# Patient Record
Sex: Male | Born: 1990 | Hispanic: Yes | Marital: Single | Smoking: Current some day smoker
Health system: Southern US, Community
[De-identification: ages and names within clinical notes are randomized; demographics above are authoritative.]

---

## 2012-09-12 ENCOUNTER — Emergency Department (HOSPITAL_COMMUNITY): Payer: Self-pay

## 2012-09-12 ENCOUNTER — Other Ambulatory Visit: Payer: Self-pay

## 2012-09-12 ENCOUNTER — Encounter (HOSPITAL_COMMUNITY): Payer: Self-pay | Admitting: Emergency Medicine

## 2012-09-12 ENCOUNTER — Emergency Department (HOSPITAL_COMMUNITY)
Admission: EM | Admit: 2012-09-12 | Discharge: 2012-09-12 | Disposition: A | Discharge status: Home / Self Care | Payer: Self-pay | Attending: Emergency Medicine | Admitting: Emergency Medicine

## 2012-09-12 DIAGNOSIS — R062 Wheezing: Secondary | ICD-10-CM | POA: Insufficient documentation

## 2012-09-12 DIAGNOSIS — F411 Generalized anxiety disorder: Secondary | ICD-10-CM | POA: Insufficient documentation

## 2012-09-12 DIAGNOSIS — R0602 Shortness of breath: Secondary | ICD-10-CM | POA: Insufficient documentation

## 2012-09-12 DIAGNOSIS — R109 Unspecified abdominal pain: Secondary | ICD-10-CM | POA: Insufficient documentation

## 2012-09-12 DIAGNOSIS — R110 Nausea: Secondary | ICD-10-CM | POA: Insufficient documentation

## 2012-09-12 DIAGNOSIS — F1010 Alcohol abuse, uncomplicated: Secondary | ICD-10-CM | POA: Insufficient documentation

## 2012-09-12 LAB — COMPREHENSIVE METABOLIC PANEL
ALT: 21 U/L (ref 0–53)
Alkaline Phosphatase: 64 U/L (ref 39–117)
BUN: 11 mg/dL (ref 6–23)
Chloride: 98 mEq/L (ref 96–112)
GFR calc Af Amer: >90 mL/min (ref >90)
Glucose, Bld: 107 mg/dL — ABNORMAL HIGH (ref 70–99)
Potassium: 4.1 mEq/L (ref 3.5–5.1)
Sodium: 136 mEq/L (ref 135–145)
Total Bilirubin: 0.5 mg/dL (ref 0.3–1.2)
Total Protein: 7.3 g/dL (ref 6.0–8.3)

## 2012-09-12 LAB — CBC WITH DIFFERENTIAL/PLATELET
Eosinophils Absolute: 1.3 10*3/uL — ABNORMAL HIGH (ref 0.0–0.7)
Hemoglobin: 15.8 g/dL (ref 13.0–17.0)
Lymphocytes Relative: 18 % (ref 12–46)
Lymphs Abs: 2.5 10*3/uL (ref 0.7–4.0)
MCH: 32.8 pg (ref 26.0–34.0)
Monocytes Relative: 9 % (ref 3–12)
Neutro Abs: 9 10*3/uL — ABNORMAL HIGH (ref 1.7–7.7)
Neutrophils Relative %: 64 % (ref 43–77)
Platelets: 232 10*3/uL (ref 150–400)
RBC: 4.81 MIL/uL (ref 4.22–5.81)
WBC: 14.2 10*3/uL — ABNORMAL HIGH (ref 4.0–10.5)

## 2012-09-12 MED ORDER — OMEPRAZOLE 20 MG PO CPDR: DELAYED_RELEASE_CAPSULE | ORAL | Status: DC

## 2012-09-12 MED ORDER — ALBUTEROL SULFATE HFA 108 (90 BASE) MCG/ACT IN AERS
2.0000 | INHALATION_SPRAY | RESPIRATORY_TRACT | Status: DC | PRN
  Administered 2012-09-12: 2 via RESPIRATORY_TRACT
  Filled 2012-09-12: qty 6.7

## 2012-09-12 MED ORDER — LORAZEPAM 1 MG PO TABS
0.5000 mg | ORAL_TABLET | Freq: Once | ORAL | Status: AC
Start: 2012-09-12 — End: 2012-09-12
  Administered 2012-09-12: 0.5 mg via ORAL
  Filled 2012-09-12: qty 1

## 2012-09-12 NOTE — ED Provider Notes (Signed)
CSN: [id number]     Arrival date & time 09/12/12  1556 History     First MD Initiated Contact with Patient 09/12/12 1717     Chief Complaint  Patient presents with  . Shortness of Breath   (Consider location/radiation/quality/duration/timing/severity/associated sxs/prior Treatment) HPI Comments: Patient with history of alcohol abuse, smoking, born [place] -- presents with complaint of shortness of breath for the past 4 months. Shortness of breath has been intermittent but daily. He gets a panic attack when he feels short of breath. It felt worse today so the patient came to the emergency department for evaluation. Shortness of breath is worse at night and with activity. He has some occasional wheezing but no previous diagnosis of asthma. He denies history of tuberculosis or other lung disease. He denies risk factors for pulmonary embolism including recent travel or immobilization, recent surgery, history of blood clot, swelling in his legs. No treatments prior to arrival. Patient denies chest pain. He has vague generalized upper abdominal pain. Also bilateral lower abd pain, worse on left, that has been going on for weeks that is intermittent, not everyday. He admits to drinking heavily yesterday. No nausea, vomiting, or diarrhea. Onset of symptoms gradual. Nothing makes symptoms better.  Patient is a 22 y.o. male presenting with shortness of breath. The history is provided by the patient.  Shortness of Breath Associated symptoms: abdominal pain and wheezing   Associated symptoms: no chest pain, no cough, no fever, no headaches, no rash, no sore throat and no vomiting     History reviewed. No pertinent past medical history. History reviewed. No pertinent past surgical history. No family history on file. History  Substance Use Topics  . Smoking status: Not on file  . Smokeless tobacco: Not on file  . Alcohol Use: Not on file    Review of Systems  Constitutional: Negative for  fever.  HENT: Negative for sore throat and rhinorrhea.   Eyes: Negative for redness.  Respiratory: Positive for shortness of breath and wheezing. Negative for cough.   Cardiovascular: Negative for chest pain.  Gastrointestinal: Positive for nausea and abdominal pain. Negative for vomiting and diarrhea.  Genitourinary: Negative for dysuria.  Musculoskeletal: Negative for myalgias.  Skin: Negative for rash.  Neurological: Negative for headaches.    Allergies  Review of patient's allergies indicates not on file.  Home Medications  No current outpatient prescriptions on file. BP 120/66  Pulse 79  Temp(Src) 98.7 F (37.1 C) (Oral)  Resp 25  Ht 5\' 3"  (1.6 m)  Wt 160 lb (72.576 kg)  BMI 28.35 kg/m2  SpO2 100% Physical Exam  Nursing note and vitals reviewed. Constitutional: He appears well-developed and well-nourished.  HENT:  Head: Normocephalic and atraumatic.  Eyes: Conjunctivae are normal. Right eye exhibits no discharge. Left eye exhibits no discharge.  Neck: Normal range of motion. Neck supple.  Cardiovascular: Normal rate, regular rhythm and normal heart sounds.   No murmur heard. Pulmonary/Chest: Effort normal and breath sounds normal. No respiratory distress. He has no wheezes. He has no rales.  Abdominal: Soft. There is tenderness (mild) in the right upper quadrant, right lower quadrant, epigastric area, left upper quadrant and left lower quadrant. There is no rigidity, no rebound, no guarding, no CVA tenderness, no tenderness at McBurney's point and negative Murphy's sign.  Neurological: He is alert.  Skin: Skin is warm and dry.  Psychiatric: His mood appears anxious.    ED Course   Procedures (including critical care time)  Labs  Reviewed  CBC WITH DIFFERENTIAL - Abnormal; Notable for the following:    WBC 14.2 (*)    MCHC 36.8 (*)    Neutro Abs 9.0 (*)    Monocytes Absolute 1.3 (*)    Eosinophils Relative 9 (*)    Eosinophils Absolute 1.3 (*)    All other  components within normal limits  COMPREHENSIVE METABOLIC PANEL - Abnormal; Notable for the following:    Glucose, Bld 107 (*)    All other components within normal limits  LIPASE, BLOOD   Dg Chest 2 View  09/12/2012   *RADIOLOGY REPORT*  Clinical Data: Shortness of breath, history smoking  CHEST - 2 VIEW  Comparison: None  Findings: Normal heart size, mediastinal contours, and pulmonary vascularity. Mild peribronchial thickening. Question calcified granuloma at left apex. Lungs otherwise clear. No pleural effusion or pneumothorax. Bones unremarkable.  IMPRESSION: Bronchitic and question old granulomatous disease changes. No acute abnormalities.   Original Report Authenticated By: Ulyses Southward, M.D.   1. Shortness of breath   2. Abdominal pain   3. Alcohol abuse     5:45 PM Patient seen and examined. Work-up initiated. Medications ordered.   Vital signs reviewed and are as follows: Filed Vitals:   09/12/12 1630  BP: 120/66  Pulse: 79  Temp:   Resp: 25    Date: 09/12/2012  Rate: 95  Rhythm: normal sinus rhythm  QRS Axis: normal  Intervals: normal  ST/T Wave abnormalities: early repolarization  Conduction Disutrbances:none  Narrative Interpretation:   Old EKG Reviewed: none available  8:09 PM patient informed of lab results. His exam is unchanged. His abdomen continues to be mildly tender and soft.  PCP referral given. Substance abuse referrals given.   The patient was urged to return to the Emergency Department immediately with worsening of current symptoms, worsening abdominal pain, persistent vomiting, blood noted in stools, fever, or any other concerns. The patient verbalized understanding.   Patient counseled on use of albuterol HFA.  Told to use 1-2 puffs q 4 hours as needed for SOB.  MDM  SOB: Ongoing for 3-4 months. CXR neg for active infection, O2 sat 100% on RA. Pt is anxious. He appears well. Suspect anxiety component. He is PERC neg and I doubt PE. Cardiac exam  normal. PCP f/u indicated.   Upper abd pain: Suspect GI related given ETOH use, normal lipase, LFTs. Omeprazole rx. Pt counseled to decrease tobacco and ETOH use.   Low abd pain: Ongoing for months. Intermittent. Not localized on my exam. Abd soft. No dysuria. Do not suspect appendicitis, no focal RLQ pain. Elevated WBC but this and of it self does not warrant CT given history of ongoing symptoms, no fever, no localized pain.   ETOH abuse: referrals given. Pt encouraged to f/u for help with alcohol.   Lower abd pain    Jared Gonzales, [place] 09/12/12 2013

## 2012-09-12 NOTE — ED Notes (Signed)
Pt brought to ED by EMS with SOB.As per EMS pt has SOB for last 4 months and feels more worse today.On RA SPO2 100

## 2012-09-16 ENCOUNTER — Emergency Department (HOSPITAL_COMMUNITY)
Admission: EM | Admit: 2012-09-16 | Discharge: 2012-09-16 | Disposition: A | Discharge status: Home / Self Care | Payer: Self-pay | Attending: Emergency Medicine | Admitting: Emergency Medicine

## 2012-09-16 ENCOUNTER — Emergency Department (HOSPITAL_COMMUNITY): Payer: Self-pay

## 2012-09-16 ENCOUNTER — Encounter (HOSPITAL_COMMUNITY): Payer: Self-pay | Admitting: *Deleted

## 2012-09-16 DIAGNOSIS — R0789 Other chest pain: Secondary | ICD-10-CM

## 2012-09-16 DIAGNOSIS — R071 Chest pain on breathing: Secondary | ICD-10-CM | POA: Insufficient documentation

## 2012-09-16 DIAGNOSIS — F17200 Nicotine dependence, unspecified, uncomplicated: Secondary | ICD-10-CM | POA: Insufficient documentation

## 2012-09-16 DIAGNOSIS — R51 Headache: Secondary | ICD-10-CM | POA: Insufficient documentation

## 2012-09-16 DIAGNOSIS — R7309 Other abnormal glucose: Secondary | ICD-10-CM | POA: Insufficient documentation

## 2012-09-16 DIAGNOSIS — F1010 Alcohol abuse, uncomplicated: Secondary | ICD-10-CM | POA: Insufficient documentation

## 2012-09-16 LAB — POCT I-STAT, CHEM 8
BUN: 13 mg/dL (ref 6–23)
Calcium, Ion: 1.05 mmol/L — ABNORMAL LOW (ref 1.12–1.23)
Chloride: 103 mEq/L (ref 96–112)
Glucose, Bld: 134 mg/dL — ABNORMAL HIGH (ref 70–99)
HCT: 44 % (ref 39.0–52.0)
TCO2: 19 mmol/L (ref 0–100)

## 2012-09-16 MED ORDER — ACETAMINOPHEN 325 MG PO TABS
650.0000 mg | ORAL_TABLET | Freq: Once | ORAL | Status: AC
  Administered 2012-09-16: 650 mg via ORAL
  Filled 2012-09-16: qty 2

## 2012-09-16 MED ORDER — SODIUM CHLORIDE 0.9 % IV BOLUS (SEPSIS)
1000.0000 mL | Freq: Once | INTRAVENOUS | Status: AC
  Administered 2012-09-16: 1000 mL via INTRAVENOUS

## 2012-09-16 NOTE — ED Provider Notes (Addendum)
CSN: [id number]     Arrival date & time 09/16/12  0228 History     First MD Initiated Contact with Patient 09/16/12 0343     No chief complaint on file.  (Consider location/radiation/quality/duration/timing/severity/associated sxs/prior Treatment) HPI Complains of anterior chest pain, nonradiating and diffuse headache onset approximately 2 hours prior to coming here. Chest pain is pleuritic. Patient admits to drinking copious beer prior to coming here tonight. No other associated symptoms. Chest Pain worse with deep inspiration improved by anything. Mild at present. Headache is diffuse, gradual onset, mild to moderate at present.Marland Kitchen He denies trauma. No shortness of breath. No other associated symptoms. Brought by EMS. Treated with aspirin while en route History reviewed. No pertinent past medical history. History reviewed. No pertinent past surgical history. Family History  Problem Relation Age of Onset  . Diabetes Mother    History  Substance Use Topics  . Smoking status: Current Some Day Smoker    Types: Cigarettes  . Smokeless tobacco: Not on file  . Alcohol Use: 29.4 oz/week    49 Cans of beer per week    Review of Systems  Constitutional: Negative.   Respiratory: Negative.   Cardiovascular: Positive for chest pain.  Gastrointestinal: Negative.   Musculoskeletal: Negative.   Skin: Negative.   Neurological: Positive for headaches.  Psychiatric/Behavioral: Negative.   All other systems reviewed and are negative.    Allergies  Review of patient's allergies indicates no known allergies.  Home Medications  No current outpatient prescriptions on file. BP 120/66  Pulse 85  Temp(Src) 98.2 F (36.8 C) (Oral)  Resp 22  Ht 5\' 3"  (1.6 m)  Wt 160 lb (72.576 kg)  BMI 28.35 kg/m2  SpO2 97% Physical Exam  Nursing note and vitals reviewed. Constitutional: He is oriented to person, place, and time. He appears well-developed and well-nourished. No distress.  Does not appear  intoxicated  HENT:  Head: Normocephalic and atraumatic.  Eyes: Conjunctivae are normal. Pupils are equal, round, and reactive to light.  Neck: Neck supple. No tracheal deviation present. No thyromegaly present.  Cardiovascular: Normal rate and regular rhythm.   No murmur heard. Pulmonary/Chest: Effort normal and breath sounds normal. He exhibits tenderness.  Chest wall tender, reproducing pain exactly  Abdominal: Soft. Bowel sounds are normal. He exhibits no distension. There is no tenderness.  Musculoskeletal: Normal range of motion. He exhibits no edema and no tenderness.  Neurological: He is alert and oriented to person, place, and time. No cranial nerve deficit. Coordination normal.  Skin: Skin is warm and dry. No rash noted.  Psychiatric: He has a normal mood and affect.    ED Course   Date: 09/16/2012  Rate: 85  Rhythm: normal sinus rhythm  QRS Axis: normal  Intervals: normal  ST/T Wave abnormalities: nonspecific T wave changes  Conduction Disutrbances:none  Narrative Interpretation:   Old EKG Reviewed: none available  Procedures (including critical care time) 6:25 AM patient feels improved after treatment with Tylenol and intravenous fluids. Labs Reviewed - No data to display No results found. No diagnosis found. Chest x-ray viewed by me. Results for orders placed during the hospital encounter of 09/16/12  POCT I-STAT, CHEM 8      Result Value Range   Sodium 136  135 - 145 mEq/L   Potassium 3.9  3.5 - 5.1 mEq/L   Chloride 103  96 - 112 mEq/L   BUN 13  6 - 23 mg/dL   Creatinine, Ser 4.09  0.50 - 1.35 mg/dL  Glucose, Bld 134 (*) 70 - 99 mg/dL   Calcium, Ion 0.98 (*) 1.12 - 1.23 mmol/L   TCO2 19  0 - 100 mmol/L   Hemoglobin 15.0  13.0 - 17.0 g/dL   HCT 11.9  14.7 - 82.9 %   Dg Chest 2 View  09/16/2012   *RADIOLOGY REPORT*  Clinical Data: Chest pain  CHEST - 2 VIEW  Comparison: 09/12/2012  Findings: Right lung granuloma versus vessel on end. No focal consolidation,  pleural effusion, or pneumothorax. Cardiomediastinal contours are unchanged.  No acute osseous finding.  IMPRESSION: No radiographic evidence of acute cardiopulmonary process.   Original Report Authenticated By: Jearld Lesch, M.D.   Dg Chest 2 View  09/12/2012   *RADIOLOGY REPORT*  Clinical Data: Shortness of breath, history smoking  CHEST - 2 VIEW  Comparison: None  Findings: Normal heart size, mediastinal contours, and pulmonary vascularity. Mild peribronchial thickening. Question calcified granuloma at left apex. Lungs otherwise clear. No pleural effusion or pneumothorax. Bones unremarkable.  IMPRESSION: Bronchitic and question old granulomatous disease changes. No acute abnormalities.   Original Report Authenticated By: Ulyses Southward, M.D.    MDM  Chest pain felt to be musculoskeletal in etiology. Headache felt to be nonspecific. Patient admits that he has an alcohol problem He is referred to the resource guide to seek help with his alcohol problem and to get a primary care physician. Counseled patient for 5 minutes on smoking cessation Diagnosis #1 nonspecific headache Diagnosis #2 chest wall pain #3 hyperglycemia #4 tobacco abuse #5 alcohol abuse   Doug Sou, MD 09/16/12 5621  Doug Sou, MD 09/16/12 [phone]

## 2012-09-16 NOTE — ED Notes (Signed)
EMS states pt c/o chest pain left side radiates to back for one hour and 30 minutes. Describes as pressure. Increases w breathing not w palpation. Admits to drinking (10-40 ounce beers). EMS gave 324 of aspirin.

## 2012-09-16 NOTE — ED Notes (Signed)
Pt states that he is having chest pain on the left side of his chest and his head for the past 2 hours. Pt states he drank 15 - 24 ounce beers and he states that might be why he's hurting.

## 2012-09-19 NOTE — ED Provider Notes (Signed)
Medical screening examination/treatment/procedure(s) were performed by non-physician practitioner and as supervising physician I was immediately available for consultation/collaboration.  Kerianna Rawlinson, MD 09/19/12 0611 

## 2013-04-14 ENCOUNTER — Emergency Department (HOSPITAL_COMMUNITY)
Admission: EM | Admit: 2013-04-14 | Discharge: 2013-04-14 | Disposition: A | Discharge status: Home / Self Care | Payer: Self-pay | Attending: Emergency Medicine | Admitting: Emergency Medicine

## 2013-04-14 ENCOUNTER — Emergency Department (HOSPITAL_COMMUNITY): Payer: Self-pay

## 2013-04-14 ENCOUNTER — Encounter (HOSPITAL_COMMUNITY): Payer: Self-pay | Admitting: Emergency Medicine

## 2013-04-14 DIAGNOSIS — K2920 Alcoholic gastritis without bleeding: Secondary | ICD-10-CM | POA: Insufficient documentation

## 2013-04-14 DIAGNOSIS — F17200 Nicotine dependence, unspecified, uncomplicated: Secondary | ICD-10-CM | POA: Insufficient documentation

## 2013-04-14 DIAGNOSIS — R109 Unspecified abdominal pain: Secondary | ICD-10-CM

## 2013-04-14 DIAGNOSIS — R079 Chest pain, unspecified: Secondary | ICD-10-CM | POA: Insufficient documentation

## 2013-04-14 DIAGNOSIS — R259 Unspecified abnormal involuntary movements: Secondary | ICD-10-CM | POA: Insufficient documentation

## 2013-04-14 DIAGNOSIS — R000 Tachycardia, unspecified: Secondary | ICD-10-CM | POA: Insufficient documentation

## 2013-04-14 DIAGNOSIS — F1010 Alcohol abuse, uncomplicated: Secondary | ICD-10-CM | POA: Insufficient documentation

## 2013-04-14 LAB — COMPREHENSIVE METABOLIC PANEL
ALBUMIN: 4.7 g/dL (ref 3.5–5.2)
ALK PHOS: 81 U/L (ref 39–117)
ALT: 31 U/L (ref 0–53)
AST: 29 U/L (ref 0–37)
BILIRUBIN TOTAL: 0.4 mg/dL (ref 0.3–1.2)
BUN: 12 mg/dL (ref 6–23)
CHLORIDE: 96 meq/L (ref 96–112)
CO2: 22 mEq/L (ref 19–32)
Calcium: 9.6 mg/dL (ref 8.4–10.5)
Creatinine, Ser: 0.81 mg/dL (ref 0.50–1.35)
GFR calc Af Amer: >90 mL/min (ref >90)
GFR calc non Af Amer: >90 mL/min (ref >90)
Glucose, Bld: 104 mg/dL — ABNORMAL HIGH (ref 70–99)
POTASSIUM: 4.3 meq/L (ref 3.7–5.3)
SODIUM: 139 meq/L (ref 137–147)
Total Protein: 7.9 g/dL (ref 6.0–8.3)

## 2013-04-14 LAB — URINALYSIS, ROUTINE W REFLEX MICROSCOPIC
Bilirubin Urine: NEGATIVE
GLUCOSE, UA: NEGATIVE mg/dL
HGB URINE DIPSTICK: NEGATIVE
Ketones, ur: 15 mg/dL — AB
Leukocytes, UA: NEGATIVE
Nitrite: NEGATIVE
PH: 6 (ref 5.0–8.0)
Protein, ur: NEGATIVE mg/dL
SPECIFIC GRAVITY, URINE: 1.029 (ref 1.005–1.030)
Urobilinogen, UA: 0.2 mg/dL (ref 0.0–1.0)

## 2013-04-14 LAB — CBC WITH DIFFERENTIAL/PLATELET
BASOS PCT: 1 % (ref 0–1)
Basophils Absolute: 0.1 10*3/uL (ref 0.0–0.1)
Eosinophils Absolute: 1.3 10*3/uL — ABNORMAL HIGH (ref 0.0–0.7)
Eosinophils Relative: 11 % — ABNORMAL HIGH (ref 0–5)
HCT: 44 % (ref 39.0–52.0)
HEMOGLOBIN: 16.1 g/dL (ref 13.0–17.0)
Lymphocytes Relative: 28 % (ref 12–46)
Lymphs Abs: 3.3 10*3/uL (ref 0.7–4.0)
MCH: 32 pg (ref 26.0–34.0)
MCHC: 36.6 g/dL — AB (ref 30.0–36.0)
MCV: 87.5 fL (ref 78.0–100.0)
MONOS PCT: 6 % (ref 3–12)
Monocytes Absolute: 0.7 10*3/uL (ref 0.1–1.0)
NEUTROS ABS: 6.5 10*3/uL (ref 1.7–7.7)
NEUTROS PCT: 55 % (ref 43–77)
Platelets: 222 10*3/uL (ref 150–400)
RBC: 5.03 MIL/uL (ref 4.22–5.81)
RDW: 12.8 % (ref 11.5–15.5)
WBC: 11.8 10*3/uL — ABNORMAL HIGH (ref 4.0–10.5)

## 2013-04-14 LAB — I-STAT TROPONIN, ED: Troponin i, poc: 0 ng/mL (ref 0.00–0.08)

## 2013-04-14 LAB — LIPASE, BLOOD: LIPASE: 29 U/L (ref 11–59)

## 2013-04-14 MED ORDER — IOHEXOL 300 MG/ML  SOLN
25.0000 mL | INTRAMUSCULAR | Status: AC
  Administered 2013-04-14: 25 mL via ORAL

## 2013-04-14 MED ORDER — FENTANYL CITRATE 0.05 MG/ML IJ SOLN
50.0000 ug | Freq: Once | INTRAMUSCULAR | Status: AC
  Administered 2013-04-14: 50 ug via INTRAVENOUS
  Filled 2013-04-14: qty 2

## 2013-04-14 MED ORDER — FAMOTIDINE 20 MG PO TABS: 20.0000 mg | ORAL_TABLET | Freq: Two times a day (BID) | ORAL | Status: DC

## 2013-04-14 MED ORDER — PANTOPRAZOLE SODIUM 40 MG IV SOLR
40.0000 mg | Freq: Once | INTRAVENOUS | Status: AC
  Administered 2013-04-14: 40 mg via INTRAVENOUS
  Filled 2013-04-14: qty 40

## 2013-04-14 MED ORDER — IOHEXOL 300 MG/ML  SOLN
80.0000 mL | Freq: Once | INTRAMUSCULAR | Status: AC | PRN
  Administered 2013-04-14: 80 mL via INTRAVENOUS

## 2013-04-14 MED ORDER — ONDANSETRON HCL 4 MG/2ML IJ SOLN
4.0000 mg | Freq: Once | INTRAMUSCULAR | Status: AC
  Administered 2013-04-14: 4 mg via INTRAVENOUS
  Filled 2013-04-14: qty 2

## 2013-04-14 MED ORDER — PANTOPRAZOLE SODIUM 40 MG PO TBEC: 40.0000 mg | DELAYED_RELEASE_TABLET | Freq: Every day | ORAL | Status: DC

## 2013-04-14 MED ORDER — LORAZEPAM 2 MG/ML IJ SOLN
1.0000 mg | Freq: Once | INTRAMUSCULAR | Status: AC
  Administered 2013-04-14: 1 mg via INTRAVENOUS
  Filled 2013-04-14: qty 1

## 2013-04-14 MED ORDER — FAMOTIDINE 20 MG PO TABS
20.0000 mg | ORAL_TABLET | Freq: Once | ORAL | Status: AC
  Administered 2013-04-14: 20 mg via ORAL
  Filled 2013-04-14: qty 1

## 2013-04-14 MED ORDER — FENTANYL CITRATE 0.05 MG/ML IJ SOLN: 50.0000 ug | Freq: Once | INTRAMUSCULAR | Status: DC

## 2013-04-14 MED ORDER — SODIUM CHLORIDE 0.9 % IV BOLUS (SEPSIS)
1000.0000 mL | Freq: Once | INTRAVENOUS | Status: AC
  Administered 2013-04-14: 1000 mL via INTRAVENOUS

## 2013-04-14 MED ORDER — LORAZEPAM 1 MG PO TABS
1.0000 mg | ORAL_TABLET | Freq: Once | ORAL | Status: AC
  Administered 2013-04-14: 1 mg via ORAL
  Filled 2013-04-14: qty 1

## 2013-04-14 NOTE — ED Notes (Signed)
Pt c/o burning sensation from mid-epigastric straight up into throat. Pt reports he has been drinking "a lot" due to stress from personal "things". sts usually he drinks about 6 beers but he has been drinking more than that lately. Pt sts the pain started around 230 am. sts around 4am this morning he vomited and saw blood in it. Pt has tenderness to left lower quadrant with palpation. sts also having some diarrhea. sts last drink of ETOH was around 9pm yesterday. Pt sts he has been getting panic attacks and is feeling very anxious right now. Denies SI/HI. Nad, skin warm and dry, resp e/u.

## 2013-04-14 NOTE — ED Notes (Signed)
Pt finished drinking oral CT contrast. CT has been notified. 

## 2013-04-14 NOTE — ED Provider Notes (Signed)
CSN: [id number]     Arrival date & time 04/14/13  [id number] History   First MD Initiated Contact with Patient 04/14/13 [id number]     Chief Complaint  Patient presents with  . Abdominal Pain     (Consider location/radiation/quality/duration/timing/severity/associated sxs/prior Treatment) HPI Comments: 23 year old male presents with abdominal pain started about 3-4 hours ago. He states it woke him up in the middle the night. He normally drinks 6 beers per day for the last 3 days been taking significantly more amounts of alcohol. He is unable to quantify how many drinks she's been having. He states the thrown up blood in his vomit. Has been having diarrhea but denies any blood in the stool. The pain seems to be epigastric and radiates up to his chest. He also has pain in his back. He describes his pain as a burning. He currently rates the pain as severe. Denies any dyspnea. Has never had pain like this before.   No past medical history on file. No past surgical history on file. Family History  Problem Relation Age of Onset  . Diabetes Mother    History  Substance Use Topics  . Smoking status: Current Some Day Smoker    Types: Cigarettes  . Smokeless tobacco: Not on file  . Alcohol Use: 29.4 oz/week    49 Cans of beer per week    Review of Systems  Constitutional: Negative for fever.  Respiratory: Negative for shortness of breath.   Cardiovascular: Positive for chest pain.  Gastrointestinal: Positive for nausea, vomiting, abdominal pain and diarrhea. Negative for blood in stool.  All other systems reviewed and are negative.      Allergies  Review of patient's allergies indicates no known allergies.  Home Medications  No current outpatient prescriptions on file. BP 135/82  Pulse 92  Temp(Src) 98.9 F (37.2 C) (Oral)  Resp 22  Ht 5\' 4"  (1.626 m)  Wt 170 lb (77.111 kg)  BMI 29.17 kg/m2  SpO2 100% Physical Exam  Nursing note and vitals reviewed. Constitutional: He is oriented to  person, place, and time. He appears well-developed and well-nourished.  HENT:  Head: Normocephalic and atraumatic.  Right Ear: External ear normal.  Left Ear: External ear normal.  Nose: Nose normal.  Eyes: Right eye exhibits no discharge. Left eye exhibits no discharge.  Neck: Neck supple.  Cardiovascular: Regular rhythm, normal heart sounds and intact distal pulses.  Tachycardia present.   Pulmonary/Chest: Effort normal and breath sounds normal.  Abdominal: Soft. Normal appearance. There is tenderness in the epigastric area and left lower quadrant. There is no rigidity and no guarding.  Musculoskeletal: He exhibits no edema.  Neurological: He is alert and oriented to person, place, and time. He displays tremor.  Skin: Skin is warm and dry.    ED Course  Procedures (including critical care time) Labs Review Labs Reviewed  CBC WITH DIFFERENTIAL - Abnormal; Notable for the following:    WBC 11.8 (*)    MCHC 36.6 (*)    Eosinophils Relative 11 (*)    Eosinophils Absolute 1.3 (*)    All other components within normal limits  COMPREHENSIVE METABOLIC PANEL - Abnormal; Notable for the following:    Glucose, Bld 104 (*)    All other components within normal limits  URINALYSIS, ROUTINE W REFLEX MICROSCOPIC - Abnormal; Notable for the following:    Ketones, ur 15 (*)    All other components within normal limits  LIPASE, BLOOD  I-STAT TROPOININ, ED   Imaging Review  Ct Abdomen Pelvis W Contrast  04/14/2013   CLINICAL DATA:  Diffuse abdominal pain  EXAM: CT ABDOMEN AND PELVIS WITH CONTRAST  TECHNIQUE: Multidetector CT imaging of the abdomen and pelvis was performed using the standard protocol following bolus administration of intravenous contrast.  CONTRAST:  80mL OMNIPAQUE IOHEXOL 300 MG/ML  SOLN  COMPARISON:  None.  FINDINGS: The lung bases are unremarkable.  The liver, spleen, adrenals, pancreas, kidneys are unremarkable.  An 8 mm lymph node is appreciated within the right pericolic  gutter region of questionable clinical significance. There is no evidence of associated free fluid, loculated fluid collections. The appendix is identified and is unremarkable.  There is no CT evidence of colitis, enteritis, diverticulitis, nor appendicitis. There is no evidence of abdominal aortic aneurysm. The celiac, SMA, IMA, portal vein are opacified. There is no evidence calcified gallstones. There is no evidence of abdominal or pelvic masses nor pathologic sized adenopathy. No evidence of free fluid, loculated fluid collections, nor pneumoperitoneum.  A very small fat containing umbilical hernia is appreciated. No further evidence of abdominal wall nor inguinal hernia. There is no evidence of aggressive appearing osseous lesions.  IMPRESSION: 1. No CT evidence of obstructive or inflammatory abnormalities. 2. Small right pericolic gutter lymph node is may reflect a small reactive lymph node. 3. Otherwise no focal acute abnormalities accounting for the patient's clinical presentation.   Electronically Signed   By: Salome Holmes M.D.   On: 04/14/2013 10:47   Dg Abd Acute W/chest  04/14/2013   CLINICAL DATA:  Chest and abdominal pain.  Hematemesis.  EXAM: ACUTE ABDOMEN SERIES (ABDOMEN 2 VIEW & CHEST 1 VIEW)  COMPARISON:  Chest x-ray dated 09/16/2012  FINDINGS: Heart and lungs appear normal. No free air or free fluid in the abdomen. The bowel gas pattern is normal. Faint calcifications are seen in the mid abdomen, possibly within the pancreas.  No osseous abnormality.  IMPRESSION: No acute abnormalities. Calcifications in the mid abdomen may represent calcific pancreatitis.   Electronically Signed   By: Geanie Cooley M.D.   On: 04/14/2013 07:59     EKG Interpretation   Date/Time:  Monday April 14 2013 07:04:25 EDT Ventricular Rate:  94 PR Interval:  115 QRS Duration: 85 QT Interval:  333 QTC Calculation: 416 R Axis:   88 Text Interpretation:  Sinus rhythm Borderline short PR interval ST elev,   probable normal early repol pattern Baseline wander in lead(s) I III aVL  No significant change since last tracing Confirmed by Lihanna Biever  MD, Nolin Grell  ([id number]) on 04/14/2013 7:28:50 AM      MDM   Final diagnoses:  Abdominal pain  Alcoholic gastritis  Alcohol abuse    Patient's pain appears to be coming from alcoholic gastritis. There is no vomiting or hematemesis here. I discussed a rectal exam as he is talked about hematemesis the patient declines. He states he had a normal bowel movement on the ED that did not have blood. He has a normal hemoglobin. His number exam is soft and has a negative CT scan. At this time we'll treat as if gastritis with a PPI and recommend cutting back his alcohol in finding outpatient alcohol treatment. The patient agrees to this and understands the return precautions.    Audree Camel, MD 04/14/13 [phone]

## 2013-04-14 NOTE — ED Notes (Signed)
Pt ambulated to restroom. 

## 2013-04-14 NOTE — Discharge Instructions (Signed)
Dolor abdominal en adultos (Abdominal Pain, Adult) El dolor puede tener muchas causas. Normalmente la causa del dolor abdominal no es una enfermedad y Scientist, clinical (histocompatibility and immunogenetics) sin TEFL teacher. Frecuentemente puede controlarse y tratarse en casa. Su mdico le Medical sales representative examen fsico y posiblemente solicite anlisis de sangre y radiografas para ayudar a Chief Strategy Officer la gravedad de su dolor. Sin embargo, en [employer], debe transcurrir ms tiempo antes de que se pueda Clinical research associate una causa evidente del dolor. Antes de llegar a ese punto, es posible que su mdico no sepa si necesita ms pruebas o un tratamiento ms profundo. INSTRUCCIONES PARA EL CUIDADO EN EL HOGAR  Est atento al dolor para ver si hay cambios. Las siguientes indicaciones ayudarn a Architectural technologist que pueda sentir:  Fountain solo medicamentos de venta libre o recetados, segn las indicaciones del mdico.  No tome laxantes a menos que se lo haya indicado su mdico.  Pruebe con Neomia Dear dieta lquida absoluta (caldo, t o agua) segn se lo indique su mdico. Introduzca gradualmente una dieta normal, segn su tolerancia. SOLICITE ATENCIN MDICA SI:  Tiene dolor abdominal sin explicacin.  Tiene dolor abdominal relacionado con nuseas o diarrea.  Tiene dolor cuando orina o defeca.  Experimenta dolor abdominal que lo despierta de noche.  Tiene dolor abdominal que empeora o mejora cuando come alimentos.  Tiene dolor abdominal que empeora cuando come alimentos grasosos. SOLICITE ATENCIN MDICA DE INMEDIATO SI:   El dolor no desaparece en un plazo mximo de 2horas.  Tiene fiebre.  No deja de (vomitar).  El Engineer, mining se siente solo en partes del abdomen, como el lado derecho o la parte inferior izquierda del abdomen.  Evaca materia fecal sanguinolenta o negra, de aspecto alquitranado. ASEGRESE DE QUE:  Comprende estas instrucciones.  Controlar su afeccin.  Recibir ayuda de inmediato si no mejora o si empeora. Document  Released: 01/09/2005 Document Revised: 10/30/2012 [facility] Patient Information 2014 [place], [place].    Problemas Con El Alcohol (Alcohol Problems) La mayora de los adultos que beben alcohol lo hacen con moderacin (no demasiado) y poseen bajo riesgo de Warehouse manager problemas relacionados con la bebida. Sin embargo, todos los bebedores, inclusive los de bajo riesgo, Gaffer los riesgos de la salud que conlleva la ingesta de alcohol. RECOMENDACIONES PARA LOS BEBEDORES DE BAJO RIESGO Beber con moderacin. La ingesta moderada de alcohol se establece de la siguiente manera:   Hombres  no ms de dos tragos Google.  Mujeres  no ms de un trago Google.  Mayores de 65 aos  no ms de un trago Air cabin crew. Un trago estndar es 12 gramos de alcohol puro, lo que es lo mismo que una botella de 12 onzas de cerveza o Merchant navy officer, un vaso de vino de 5 onzas, o 1 onza y media de bebidas blancas (como whisky, brandy, vodka, o ron).  ABSTNGASE (NO BEBA) ALCOHOL:  Si est embarazada o contempla la posibilidad.  Cuando tome un medicamento que interacta con el alcohol.  Si usted es dependiente del alcohol.  Sufre alguna enfermedad en la que se prohba el consumo de alcohol (como lceras, enfermedades hepticas, o enfermedades cardacas). HABLE CON EL MDICO:  Si tiene riesgo de sufrir una enfermedad coronaria, converse sobre los potenciales beneficios y riesgos de la ingesta de alcohol: La ingesta baja a moderada de alcohol est asociada con tasas menores de enfermedades coronarias en ciertas poblaciones (por ejemplo, hombres mayores de 45 aos y mujeres postmenopusicas). Se aconseja a las personas no bebedoras o abstemias no comenzar con  la ingesta baja a moderada para reducir el riesgo de enfermedades coronarias en funcin de evitar la aparicin de un problema relacionado con el alcohol. Pueden obtenerse efectos protectores similares a travs de una dieta y ejercicios adecuados.  Las mujeres y los  ancianos tienen menos cantidad de agua en el [employer]. Como consecuencia de [place], las mujeres y los ancianos tienen mayor concentracin de alcohol en la sangre despus de beber la misma cantidad de alcohol.  La exposicin del feto al alcohol puede ocasionar una gran cantidad de defectos de nacimientos dominados [place] Alcohlico Fetal (FAS) o Defectos de Nacimiento Relacionados con el Alcohol (ARBD). Aunque los FAS y los ARBD estn asociados con el consumo excesivo de alcohol durante el [place], tambin se han informado trastornos de Leisure centre manager en nios nacidos de madres que informaron haber bebido un trago en promedio por Mudlogger.  El abuso de alcohol (como el consumo de ms de cuatro tragos por ocasin en hombres y ms de tres tragos por ocasin en mujeres) perjudica a lo cognitivo (aprendizaje) y las funciones psicomotoras e incrementa el riesgo de problemas relacionados con el alcohol, inclusive accidentes y daos. PREGUNTAS CECA:   Algunas vez ha sentido que deba cortar con la bebida?  Se ha enojado usted con alguien que lo critic por lo que bebe?  Alguna vez se ha sentido mal o culpable por lo que bebe?  Ha tomado alguna vez un trago por la maana para calmar sus nervios o para deshacerse de una "resaca" (para "abrir los ojos")? Si ha respondido de [place] positiva a [place] de estas preguntas: Podra estar en riesgo de tener problemas relacionados con el alcohol si el consumo del mismo es:   Hombres: Mayor a 14 tragos por semana o ms de 4 tragos por ocasin.  Mujeres: Mayor a 7 tragos por semana o ms de 3 tragos por ocasin. Tiene usted o su familia alguna historia clnica de problemas relacionados con el alcohol como:  Prdida del conocimiento.  Disfuncin sexual.  Depresin.  Traumatismos.  Enfermedades hepticas.  Trastornos del sueo.  Hipertensin arterial.  Dolor crnico abdominal.  Alguna vez el beber le ha ocasionado  problemas, como por ejemplo con su familia, en su rendimiento laboral (o escolar), accidentes o lesiones?  Ha tenido una compulsin a beber o se ha preocupado mientras lo haca?  Posee poco control o es incapaz de parar de beber una vez que ha comenzado?  Ha tenido que beber para evitar sntomas de abstinencia?  Ha tenido problemas con la abstinencia como temblores, nuseas, sudor o cambios en el humor?  Necesita ms alcohol que antes para emborracharse?  Siente una fuerte necesidad de beber?  Cambia de planes para poder beber?  Alguna vez ha bebido en la maana para aliviar temblores o resaca? Si ha respondido a [place] de estas preguntas de [employer], puede que sea el momento de hablar con un profesional, familiar o amigos y ver si ellos creen que tiene un problema. El alcoholismo es una dependencia qumica que puede Theme park manager y Environmental health practitioner a destruir su salud y Teacher, English as a foreign language. Muchos alcohlicos mueren, se empobrecen o terminan en prisin. Esto es a menudo el resultado de una dependencia qumica.  No se desaliente si no est listo para actuar inmediatamente.  Las decisiones para cambiar su comportamiento a menudo implican altas y bajas entre el deseo de [place] y la sensacin de que no puede decidirse.  Intente pensar ms seriamente sobre su comportamiento frente a la  bebida.  Piense en razones para dejarla. PARA OBTENER INFORMACIN ADICIONAL, CONCURRIR A:  The General Millsational Institute on Alcohol Abuse and Alcoholism (NIAAA) BasicStudents.dkwww.niaaa.nih.gov   ToysRusational Council on Alcoholism and Drug Dependence (NCADD) www.ncadd.org  American Society of Addiction Medicine (ASAM) RoyalDiary.glwww.asam.org  Document Released: 04/18/2007 Document Revised: 04/03/2011 [facility] Patient Information 2014 [place], [place].    Emergency Department Resource Guide 1) Find a Doctor and Pay Out of Pocket Although you won't have to find out who is covered by your insurance plan, it is a good idea to ask around and  get recommendations. You will then need to call the office and see if the doctor you have chosen will accept you as a new patient and what types of options they offer for patients who are self-pay. Some doctors offer discounts or will set up payment plans for their patients who do not have insurance, but you will need to ask so you aren't surprised when you get to your appointment.  2) Contact Your Local Health Department Not all health departments have doctors that can see patients for sick visits, but many do, so it is worth a call to see if yours does. If you don't know where your local health department is, you can check in your phone book. The CDC also has a tool to help you locate your state's health department, and many state websites also have listings of all of their local health departments.  3) Find a Walk-in Clinic If your illness is not likely to be very severe or complicated, you may want to try a walk in clinic. These are popping up all over the country in pharmacies, drugstores, and shopping centers. They're usually staffed by nurse practitioners or physician assistants that have been trained to treat common illnesses and complaints. They're usually fairly quick and inexpensive. However, if you have serious medical issues or chronic medical problems, these are probably not your best option.  No Primary Care Doctor: - Call Health Connect at  [phone] - they can help you locate a primary care doctor that  accepts your insurance, provides certain services, etc. - Physician Referral Service- [phone]  Chronic Pain Problems: Organization         Address  Phone   Notes  [facility]  [phone] Patients need to be referred by their primary care doctor.   Medication Assistance: Organization         Address  Phone   Notes  [facility] [address] [place], [place] [address] [phone] --Must be a resident of  [facility] -- Must have NO insurance coverage whatsoever (no Medicaid/ Medicare, etc.) -- The pt. MUST have a primary care doctor that directs their care regularly and follows them in the community   MedAssist  [phone]   [employer]  [phone]    Agencies that provide inexpensive medical care: Organization         Address  Phone   Notes  [facility]  [phone]   [facility]    [phone]   [facility] [address] [place], [place] [address] [phone]   [facility] [address], [place] [phone]) [phone]   Planned Parenthood    [phone]   [facility]    [phone]   [facility]  [address],  Phone:  [phone], Fax:  [phone] Hours of Operation:  9 am - 6 pm, M-F.  Also accepts Medicaid/Medicare and self-pay.  [facility]  [address], [place] Phone: [phone], Fax: [phone]. Hours of Operation:  8:30 am - 5:30 pm, M-F.  Also accepts Medicaid and self-pay.  [facility] [place] [address] [place] Phone: [phone]   [facility] [address] Natasha Bence [place], [place] [phone], Ext. [phone] Mondays & Thursdays: 7-9 AM.  First 15 patients are seen on a first come, first serve basis.    Medicaid-accepting [facility] Providers:  Organization         Address  Phone   Notes  [facility] [address], [place] [phone] Also accepts self-pay patients.  [facility] [address] Laurell Josephs [place], [place]  [phone]   [facility] [address], [place] [phone]   [facility] [address] [place] [phone]   Renaye Rakers [address], [place]   [phone] Only accepts [place] Access  [place] patients after they have their name applied to their card.   Self-Pay (no insurance) in [facility]:  Organization         Address  Phone   Notes  Sickle Cell Patients, [facility] [address] [place], [place] [phone]   [facility] [address] [place], [place] [phone]   [facility] [place] [facility]  [address], [place] [phone]   [facility]/Dr. Osei-Bonsu  [address] [place] or [address], [place] [phone] Phone number for both [place] and [place] locations is the same.  [facility] [address] [place] [phone]   [facility] [address], [place] or [address] [phone]   [facility] [address], [place] [phone], phone; [phone], fax Sees patients 1st and 3rd Saturday of every month.  Must not qualify for public or private insurance (i.e. Medicaid, Medicare, [facility] Health Choice, Veterans' Benefits)  Household income should be no more than 200% of the poverty level The clinic cannot treat you if you are pregnant or think you are pregnant  Sexually transmitted diseases are not treated at the clinic.    Dental Care: Organization         Address  Phone  Notes  [facility] [address] [place], [place] [phone] Accepts children up to age 63 who are enrolled in [place] or [place] Health Choice; pregnant women with a Medicaid card; and children who have applied for Medicaid or [place] Health Choice, but were declined, whose parents can pay a reduced fee at time of service.  [facility] Department of [facility]  [address], [place] [phone] Accepts children up to age 10 who are enrolled in [place] or [place] Health Choice; pregnant women with a Medicaid  card; and children who have applied for Medicaid or [place] Health Choice, but were declined, whose parents can pay a reduced fee at time of service.  [facility]  [address] [place], [place] [phone] Patients are seen by appointment only. Walk-ins are not accepted. [facility] will see patients 73 years of age and older. Monday - Tuesday (8am-5pm) Most Wednesdays (8:30-5pm) $30 per visit, cash only  [facility] Adult Dental Access PROGRAM  [address], [facility] [phone] Patients are seen by appointment only. Walk-ins are not accepted. [facility] will see patients 84 years of age and older. One Wednesday Evening (Monthly: Volunteer Based).  $30 per visit, cash only  [employer] of [employer]  [phone] for adults; Children under age 64, call Graduate Pediatric Dentistry at [phone]. Children aged 69-14, please call [phone] to request a pediatric application.  Dental services are provided in all areas of dental care including fillings, crowns and bridges, complete and partial dentures, implants, gum treatment, root canals, and extractions. Preventive care is also provided. Treatment is provided to both adults and children. Patients are selected via a lottery and there is often a waiting list.   [facility] [address], [place]  [phone] [email or web address]   [facility] [address] [place], [place] [phone], Ext. [phone] Second and Fourth Thursday of each month, opens at 6:30 AM; Clinic ends at 9 AM.  Patients are seen on a first-come first-served basis, and a limited number are seen during each clinic.   [facility]  [address] Ether Griffins [place], [place] [phone]   Eligibility Requirements You must have lived in [place], [place], or [place] counties for at least the last three months.   You cannot be eligible for state or federal sponsored [place],  including [employer]IGNA, [place], or [employer].   You generally cannot be eligible for healthcare insurance through your employer.    How to apply: Eligibility screenings are held every Tuesday and Wednesday afternoon from 1:00 pm until 4:00 pm. You do not need an appointment for the interview!  [facility] [address], [place], [place] [phone]   [facility] Health Department  [phone]   [facility] Health Department  [phone]   [facility] Health Department  [phone]    Behavioral Health Resources in the Community: Intensive Outpatient Programs Organization         Address  Phone  Notes  [facility] [address] [place], [place] [phone]   [facility] [address], [place], [place] [phone]   [facility] [address], [place], [place]  [phone]   [facility] [address]  [place], [place] [phone] or [phone]   Substance Abuse Resources Organization         Address  Phone  Notes  Alcohol and Drug Services  [phone]   [facility]  [phone]   [facility] [place]  [phone]   Floydene Flock  [phone]   Residential & Outpatient Substance Abuse Program  [phone]   Psychological Services Organization         Address  Phone  Notes  [facility] Behavioral Health  [phone]   [facility] Services  [phone]   [facility] Mental Health [address], [place] [phone] or [phone]    Mobile Crisis Teams Organization         Address  Phone  Notes  [facility], Mobile Crisis Care Unit  [phone]   [facility]  [address] [place], [place] [phone]   [facility] [address] [place] [phone]    Self-Help/Support Groups Organization  Address  Phone             Notes  Mental Health Assoc.  of [place] - variety of support groups  [phone]- I7437963 Call for more information  Narcotics Anonymous (NA), Caring Services [address], [employer] [place]  2 meetings at this location   Statistician         Address  Phone  Notes  [facility] [address],    [place]  [phone]   [facility]  [address], [place] [address], [place], [place] [phone]   [facility] [address] [place], [place] [phone] Admissions: 8am-3pm M-F  [facility] [address]    [place], [place] [phone]   [facility] [address] [place], [place], [place] [phone]   [facility] [address],  [place], [place] [phone]   [facility] - Intensive Outpatient [address] Laurell Josephs [address], [place], [place] [phone]   [facility] ([facility] [address] [place]  [place], [place] [phone] or [phone]   [facility] ([facility]S) [address] [place], [place] [phone] Accepts Medicaid  [facility] [address]  [place] [phone] Substance Abuse/Addiction Treatment   [facility] Organization         Address  Phone  Notes  [facility]  [phone]   Angie Fava, PhD [address] Ervin Knack [place], [place]   [phone] or [phone]   [facility] Behavioral   [address] [place], [place] [phone]   [facility] [address], [place], [place] [phone] Insurance/Medicaid/sponsorship through [facility] and Families [address]                                    [place], [place] [phone] Therapy/tele-psych/case  [facility] [address], [place] [phone]    Dr. Lolly Mustache  [phone]   [facility]  United Way [facility] Dept. 1) [address], [place] 2)  [address] [place] 3)  [address], [place] [phone]  [phone]   [facility] Child Abuse Hotline 450-521-2747 or [phone] (After Hours)

## 2013-04-14 NOTE — ED Notes (Signed)
Pt has decreased tremors, but still c/o pain.

## 2013-04-14 NOTE — ED Notes (Signed)
PER EMS:pt from home and reports burning sensation from throat down to his stomach. Per ems pt reports increase in drinking alcohol lately and usually drinks daily. Pt reports he is now vomiting up blood that started around 0400 this morning. Pt A&Ox4. BP-140 palpated, HR-100, O2-98% RA.

## 2013-04-14 NOTE — ED Notes (Signed)
Pt returned from radiology.

## 2013-04-14 NOTE — Discharge Planning (Signed)
P4CC Jared Gonzales, KeyCorpCommunity Liaison  Spoke to patient about primary care resources and getting established with a provider. Resource guide and my contact information was given for any future questions or concerns.

## 2013-04-14 NOTE — ED Notes (Signed)
Pt taken to CT.

## 2013-04-27 ENCOUNTER — Emergency Department (HOSPITAL_COMMUNITY): Payer: Self-pay

## 2013-04-27 ENCOUNTER — Emergency Department (HOSPITAL_COMMUNITY)
Admission: EM | Admit: 2013-04-27 | Discharge: 2013-04-27 | Disposition: A | Discharge status: Home / Self Care | Payer: Self-pay | Attending: Emergency Medicine | Admitting: Emergency Medicine

## 2013-04-27 ENCOUNTER — Encounter (HOSPITAL_COMMUNITY): Payer: Self-pay | Admitting: Emergency Medicine

## 2013-04-27 DIAGNOSIS — Z79899 Other long term (current) drug therapy: Secondary | ICD-10-CM | POA: Insufficient documentation

## 2013-04-27 DIAGNOSIS — R109 Unspecified abdominal pain: Secondary | ICD-10-CM

## 2013-04-27 DIAGNOSIS — F10229 Alcohol dependence with intoxication, unspecified: Secondary | ICD-10-CM | POA: Insufficient documentation

## 2013-04-27 DIAGNOSIS — D72829 Elevated white blood cell count, unspecified: Secondary | ICD-10-CM | POA: Insufficient documentation

## 2013-04-27 DIAGNOSIS — K2920 Alcoholic gastritis without bleeding: Secondary | ICD-10-CM | POA: Insufficient documentation

## 2013-04-27 DIAGNOSIS — F17200 Nicotine dependence, unspecified, uncomplicated: Secondary | ICD-10-CM | POA: Insufficient documentation

## 2013-04-27 DIAGNOSIS — R1013 Epigastric pain: Secondary | ICD-10-CM | POA: Insufficient documentation

## 2013-04-27 LAB — URINALYSIS, ROUTINE W REFLEX MICROSCOPIC
Bilirubin Urine: NEGATIVE
Glucose, UA: NEGATIVE mg/dL
HGB URINE DIPSTICK: NEGATIVE
Ketones, ur: NEGATIVE mg/dL
Leukocytes, UA: NEGATIVE
Nitrite: NEGATIVE
PH: 5 (ref 5.0–8.0)
Protein, ur: NEGATIVE mg/dL
SPECIFIC GRAVITY, URINE: 1.02 (ref 1.005–1.030)
Urobilinogen, UA: 0.2 mg/dL (ref 0.0–1.0)

## 2013-04-27 LAB — CBC WITH DIFFERENTIAL/PLATELET
Basophils Absolute: 0 10*3/uL (ref 0.0–0.1)
Basophils Relative: 0 % (ref 0–1)
EOS PCT: 2 % (ref 0–5)
Eosinophils Absolute: 0.2 10*3/uL (ref 0.0–0.7)
HEMATOCRIT: 48.4 % (ref 39.0–52.0)
Hemoglobin: 17.4 g/dL — ABNORMAL HIGH (ref 13.0–17.0)
LYMPHS ABS: 2.3 10*3/uL (ref 0.7–4.0)
LYMPHS PCT: 19 % (ref 12–46)
MCH: 32.4 pg (ref 26.0–34.0)
MCHC: 36 g/dL (ref 30.0–36.0)
MCV: 90.1 fL (ref 78.0–100.0)
MONO ABS: 0.4 10*3/uL (ref 0.1–1.0)
Monocytes Relative: 3 % (ref 3–12)
Neutro Abs: 9.3 10*3/uL — ABNORMAL HIGH (ref 1.7–7.7)
Neutrophils Relative %: 76 % (ref 43–77)
Platelets: 258 10*3/uL (ref 150–400)
RBC: 5.37 MIL/uL (ref 4.22–5.81)
RDW: 13 % (ref 11.5–15.5)
WBC: 12.2 10*3/uL — ABNORMAL HIGH (ref 4.0–10.5)

## 2013-04-27 LAB — COMPREHENSIVE METABOLIC PANEL
ALT: 34 U/L (ref 0–53)
AST: 30 U/L (ref 0–37)
Albumin: 5 g/dL (ref 3.5–5.2)
Alkaline Phosphatase: 98 U/L (ref 39–117)
BUN: 13 mg/dL (ref 6–23)
CALCIUM: 9.6 mg/dL (ref 8.4–10.5)
CO2: 20 mEq/L (ref 19–32)
CREATININE: 1.09 mg/dL (ref 0.50–1.35)
Chloride: 99 mEq/L (ref 96–112)
GFR calc Af Amer: >90 mL/min (ref >90)
GLUCOSE: 106 mg/dL — AB (ref 70–99)
Potassium: 3.9 mEq/L (ref 3.7–5.3)
SODIUM: 140 meq/L (ref 137–147)
Total Bilirubin: <0.2 mg/dL — ABNORMAL LOW (ref 0.3–1.2)
Total Protein: 8.5 g/dL — ABNORMAL HIGH (ref 6.0–8.3)

## 2013-04-27 LAB — LIPASE, BLOOD: Lipase: 27 U/L (ref 11–59)

## 2013-04-27 LAB — I-STAT TROPONIN, ED: Troponin i, poc: 0 ng/mL (ref 0.00–0.08)

## 2013-04-27 MED ORDER — ONDANSETRON 4 MG PO TBDP: ORAL_TABLET | ORAL | Status: DC

## 2013-04-27 MED ORDER — GI COCKTAIL ~~LOC~~
30.0000 mL | Freq: Once | ORAL | Status: AC
  Administered 2013-04-27: 30 mL via ORAL
  Filled 2013-04-27: qty 30

## 2013-04-27 MED ORDER — ONDANSETRON HCL 4 MG/2ML IJ SOLN
4.0000 mg | Freq: Once | INTRAMUSCULAR | Status: AC
  Administered 2013-04-27: 4 mg via INTRAVENOUS
  Filled 2013-04-27: qty 2

## 2013-04-27 MED ORDER — HYDROMORPHONE HCL PF 1 MG/ML IJ SOLN
1.0000 mg | Freq: Once | INTRAMUSCULAR | Status: AC
  Administered 2013-04-27: 1 mg via INTRAVENOUS
  Filled 2013-04-27: qty 1

## 2013-04-27 MED ORDER — LOPERAMIDE HCL 2 MG PO CAPS
2.0000 mg | ORAL_CAPSULE | Freq: Once | ORAL | Status: AC
  Administered 2013-04-27: 2 mg via ORAL
  Filled 2013-04-27: qty 1

## 2013-04-27 MED ORDER — PANTOPRAZOLE SODIUM 20 MG PO TBEC: 20.0000 mg | DELAYED_RELEASE_TABLET | Freq: Every day | ORAL | Status: DC

## 2013-04-27 MED ORDER — SODIUM CHLORIDE 0.9 % IV BOLUS (SEPSIS)
1000.0000 mL | Freq: Once | INTRAVENOUS | Status: AC
  Administered 2013-04-27: 1000 mL via INTRAVENOUS

## 2013-04-27 NOTE — ED Notes (Signed)
Pt states he did not fill his protonix RX because he could not afford it.

## 2013-04-27 NOTE — Discharge Instructions (Signed)
Dolor abdominal en adultos (Abdominal Pain, Adult)   Emergency Department Resource Guide 1) Find a Doctor and Pay Out of Pocket Although you won't have to find out who is covered by your insurance plan, it is a good idea to ask around and get recommendations. You will then need to call the office and see if the doctor you have chosen will accept you as a new patient and what types of options they offer for patients who are self-pay. Some doctors offer discounts or will set up payment plans for their patients who do not have insurance, but you will need to ask so you aren't surprised when you get to your appointment.  2) Contact Your Local Health Department Not all health departments have doctors that can see patients for sick visits, but many do, so it is worth a call to see if yours does. If you don't know where your local health department is, you can check in your phone book. The CDC also has a tool to help you locate your state's health department, and many state websites also have listings of all of their local health departments.  3) Find a Walk-in Clinic If your illness is not likely to be very severe or complicated, you may want to try a walk in clinic. These are popping up all over the country in pharmacies, drugstores, and shopping centers. They're usually staffed by nurse practitioners or physician assistants that have been trained to treat common illnesses and complaints. They're usually fairly quick and inexpensive. However, if you have serious medical issues or chronic medical problems, these are probably not your best option.  No Primary Care Doctor: - Call Health Connect at  [phone] - they can help you locate a primary care doctor that  accepts your insurance, provides certain services, etc. - Physician Referral Service- [phone]  Chronic Pain Problems: Organization         Address  Phone   Notes  [facility]  [phone] Patients need to be referred  by their primary care doctor.   Medication Assistance: Organization         Address  Phone   Notes  [facility] Medication [facility] [address] [place], [place] [address] [phone] --Must be a resident of [facility] -- Must have NO insurance coverage whatsoever (no Medicaid/ Medicare, etc.) -- The pt. MUST have a primary care doctor that directs their care regularly and follows them in the community   [facility]  [phone]   [employer]  [phone]    Agencies that provide inexpensive medical care: Organization         Address  Phone   Notes  [facility]  [phone]   [facility]    [phone]   [facility] [address] [place], [place] [address] [phone]   [facility] [address], [place] [phone]   Planned Parenthood    [phone]   [facility]    [phone]   [facility] and [facility]  [address], [place] Phone:  [phone], Fax:  [phone] Hours of Operation:  9 am - 6 pm, M-F.  Also accepts Medicaid/Medicare and self-pay.  [facility]  [address], [place] Phone: [phone], Fax: [phone]. Hours of Operation:  8:30 am - 5:30 pm, M-F.  Also  accepts Medicaid and self-pay.  [facility] [place] [address] [place] Phone: [phone]   [facility] [address] Natasha Bence [place], [place] [phone], Ext. [phone] Mondays & Thursdays: 7-9 AM.  First 15 patients are seen on a first come, first serve basis.    Medicaid-accepting [facility] Providers:  Organization         Address  Phone   Notes  [facility] [address], [place] [phone] Also accepts self-pay patients.  [facility] [address] Laurell Josephs [place], [place]  [phone]   [facility] [address], [place] [phone]   [facility] [address] [place] [phone]   Renaye Rakers [address], [place]   [phone] Only accepts [place] Access [place] patients after they have their name applied to their card.   Self-Pay (no insurance) in [facility]:  Organization         Address  Phone   Notes  Sickle Cell Patients, [facility] [address] [place], [place] [phone]   [facility] [address] [place], [place] [phone]   [facility]  [address], [place] [phone]   [facility]/Dr. Osei-Bonsu  [address] [place] or [address], [place] [phone] Phone number for both [place] and [place] locations is the same.  [facility] [address], [place] [phone]   [facility] [address], [place] or [address] [phone]   [facility] [address] [place] [phone], phone; [phone], fax Sees patients 1st and 3rd Saturday of every month.  Must not qualify for public or private insurance (i.e. Medicaid, Medicare, [place] Health Choice, Veterans' Benefits)  Household income should be no more than 200% of the poverty level The clinic cannot treat you if you are pregnant or think you are pregnant  Sexually transmitted diseases are not treated at the clinic.    Dental Care: Organization         Address  Phone  Notes  [facility] Department of [facility] [address] [place], [place] [phone] Accepts children up to age 50 who are enrolled in [place] or [place] Health Choice; pregnant women with a Medicaid card; and children who have applied for Medicaid or [place] Health Choice, but were declined, whose parents can pay a  reduced fee at time of service.  [facility] Department of [facility]  [address], [place] [phone] Accepts children up to age 77 who are enrolled in [place] or [place] Health Choice; pregnant women with a Medicaid card; and children who have applied for Medicaid or [place] Health Choice, but were declined, whose parents can pay a reduced fee at time of service.  [facility]  [address] [place], [place] [phone] Patients are seen by appointment only. Walk-ins are not accepted. [facility] will see patients 13 years of age and older. Monday - Tuesday (8am-5pm) Most Wednesdays (8:30-5pm) $30 per visit, cash only  [facility]  [address], [facility] [phone] Patients are seen by appointment only. Walk-ins are not accepted. [facility] will  see patients 25 years of age and older. One Wednesday Evening (Monthly: Volunteer Based).  $30 per visit, cash only  [employer] [facility]  [phone] for adults; Children under age 394, call Graduate Pediatric Dentistry at [phone]. Children aged 644-14, please call [phone] to request a pediatric application.  Dental services are provided in all areas of dental care including fillings, crowns and bridges, complete and partial dentures, implants, gum treatment, root canals, and extractions. Preventive care is also provided. Treatment is provided to both adults and children. Patients are selected via a lottery and there is often a waiting list.   [facility] [address], [place]  [phone] [email or web address]   [facility] [address], [place], [place] [phone], Ext. [phone] Second and Fourth Thursday of each month, opens at 6:30 AM; Clinic ends at 9 AM.  Patients are seen on a first-come first-served basis, and a limited number are seen during each clinic.   [facility]  [address], [place], [place] [phone]   Eligibility Requirements You must have lived in [place], [place], or [place] counties for at least the last three months.   You cannot be eligible for state or federal sponsored National Cityhealthcare insurance, including CIGNAVeterans Administration, [place]icaid, or [employer]icare.   You generally cannot be eligible for healthcare insurance through your employer.    How to apply: Eligibility screenings are held every Tuesday and Wednesday afternoon from 1:00 pm until 4:00 pm. You do not need an appointment for the interview!  [facility] [address], [place], [place] [phone]   [facility]  [phone]   [facility]  [phone]   [facility]  [phone]    Behavioral Health Resources in the Community: Intensive Outpatient Programs Organization         Address  Phone  Notes  [facility] [address], [place], [place] [phone]   [facility] [address], [place], [place] [phone]   [facility] [address], [place], [place]  [phone]   [facility] [address],  [place], [place] [phone] or [phone]   Substance Abuse Resources Organization         Address  Phone  Notes  [facility]  [phone]   [facility]  [phone]   [facility]  [phone]   [facility]  [phone]   Residential & Outpatient Substance Abuse Program  [phone]   Psychological Services Organization         Address  Phone  Notes  [facility]  [phone]- [phone]   [facility]  [phone]- [phone]   [facility] [address], [place] [phone] or [phone]    Mobile Crisis Teams Organization         Address  Phone  Notes  [facility], Mobile Crisis Care Unit  [phone]    [facility]  [address] [place], [place] [phone]   Doristine LocksSharon DeEsch [address] [place] [phone]    Self-Help/Support Groups Organization         Address  Phone             Notes  [facility] [place] - variety of support groups  336- [phone] Call for more information  Narcotics Anonymous (NA), [facility] [address],  [place]  2 meetings at this location   Residential Treatment Programs Organization         Address  Phone  Notes  [facility] [address],    [place]  [phone]   [facility]  [address] [place] [address], [place], [place] [phone]   [facility] [address] [place], [place] [phone] Admissions: 8am-3pm M-F  [facility] [address],    [place], [place] [phone]   [facility] [address] [place], [place], [place] [phone]   [facility] [address]  [place], [place] [phone]   [facility] - Intensive Outpatient [address], [place], [place] [phone]   [facility] ([facility] [address] [place].,  [place], [place] [phone] or [phone]   [facility] ([facility]) [address] [place], [place] [phone] Accepts Medicaid  [facility] [address]  [place] [phone] Substance Abuse/Addiction Treatment   [facility] Organization         Address  Phone  Notes  [facility]  [phone]   Angie Fava, PhD [address] [place], [place]   [phone] or [phone]   [facility] Behavioral   [address] [place], [place] [phone]   [facility] [address], [place], [place] [phone] Insurance/Medicaid/sponsorship through [facility] and Families [address]                                     [place], [place] [phone] Therapy/tele-psych/case  [facility] [address], [place] [phone]    Dr. Lolly Mustache  [phone]   [facility]  [facility] Dept. 1) [address] [place] 2) [address] [place] 3)  [address], [place] [phone]  [phone]   [facility] Child Abuse Hotline [phone] or [phone] (After Hours)       El dolor puede tener muchas causas. Normalmente la causa del dolor abdominal no es una enfermedad y Scientist, clinical (histocompatibility and immunogenetics) sin TEFL teacher. Frecuentemente puede controlarse y tratarse en casa. Su mdico le Medical sales representative examen fsico y posiblemente solicite anlisis de sangre y radiografas para ayudar a Chief Strategy Officer la gravedad de su dolor. Sin embargo, en [employer], debe transcurrir ms tiempo antes de que se pueda Clinical research associate una causa evidente del dolor. Antes de llegar a ese punto, es posible que su mdico no sepa si necesita ms pruebas o un tratamiento ms profundo. INSTRUCCIONES PARA EL CUIDADO EN EL HOGAR  Est atento al dolor para ver si hay cambios. Las siguientes indicaciones ayudarn a Architectural technologist que pueda sentir:  Foley solo medicamentos de venta libre o recetados, segn las indicaciones del mdico.  No tome laxantes a menos que se lo haya indicado su mdico.  Pruebe con Neomia Dear dieta lquida absoluta (caldo, t o agua) segn se lo indique su mdico. Introduzca gradualmente una dieta normal, segn su tolerancia. SOLICITE ATENCIN MDICA SI:  Tiene dolor abdominal sin explicacin.  Tiene dolor abdominal relacionado con nuseas o diarrea.  Tiene dolor cuando orina o defeca.  Experimenta dolor abdominal que lo despierta de noche.  Tiene dolor abdominal que empeora o mejora cuando come alimentos.  Tiene  dolor abdominal que empeora cuando come alimentos grasosos. SOLICITE ATENCIN MDICA DE INMEDIATO SI:   El dolor no desaparece  en un plazo mximo de 2horas.  Tiene fiebre.  No deja de (vomitar).  El Engineer, mining se siente solo en partes del abdomen, como el lado derecho o la parte inferior izquierda del abdomen.  Evaca materia fecal sanguinolenta o negra, de aspecto alquitranado. ASEGRESE DE QUE:  Comprende estas instrucciones.  Controlar su afeccin.  Recibir ayuda de inmediato si no mejora o si empeora. Document Released: 01/09/2005 Document Revised: 10/30/2012 [facility] Patient Information 2014 [place], [place].

## 2013-04-27 NOTE — ED Notes (Signed)
Pt arrives vis EMS from home. C/o abd pain/N/V/D sudden onset [id number]. Belly appears tight, distended, tender UQ. Pt admits to ETOH use everyday (6 pack/day, only 1 beer today). Pts mom with pt states she ate similar foods and feels fine. BP 140/100 HR 100 RR 20.

## 2013-04-27 NOTE — ED Provider Notes (Signed)
I saw and evaluated the patient, reviewed the resident's note and I agree with the findings and plan.   EKG Interpretation None      Pt is a 23 y.o. M with history of alcohol abuse and tobacco abuse who presents emergency department with sudden onset nausea, vomiting and diarrhea as well as diffuse abdominal pain. Patient was seen in the emergency department several weeks ago for similar symptoms. That time he had negative CT scan. His abdominal exam is benign he does appear uncomfortable. We'll give antibiotics, IV fluids, Imodium. We'll check abdominal labs and x-ray. Likely alcoholic gastritis versus pancreatitis.  Layla MawKristen N Ward, DO 04/28/13 0003

## 2013-04-27 NOTE — ED Notes (Signed)
Pt taken to xray 

## 2013-04-27 NOTE — ED Notes (Signed)
Pt up to b/r prior to d/c. Steady gait.

## 2013-04-27 NOTE — ED Provider Notes (Signed)
CSN: [id number]     Arrival date & time 04/27/13  2003 History   First MD Initiated Contact with Patient 04/27/13 2014     Chief Complaint  Patient presents with  . Abdominal Pain    Patient is a 23 y.o. male presenting with abdominal pain.  Abdominal Pain Pain location:  Epigastric Pain quality: burning and cramping   Pain radiates to:  Does not radiate Pain severity:  Severe Onset quality:  Sudden Duration:  1 hour Timing:  Constant Progression:  Unchanged Chronicity:  Recurrent Context: alcohol use (He is a daily, heavy alcohol drinker.  Drinks a 6 pack of beer every day)   Context: not previous surgeries, not recent illness, not recent travel, not sick contacts, not suspicious food intake and not trauma   Relieved by:  Nothing Worsened by:  Nothing tried Ineffective treatments:  None tried Associated symptoms: diarrhea, nausea and vomiting   Associated symptoms: no chest pain, no chills, no cough, no dysuria, no fever, no hematemesis, no hematochezia, no hematuria, no melena, no shortness of breath and no sore throat   Diarrhea:    Quality:  Semi-solid and watery   Number of occurrences:  10   Severity:  Severe   Progression:  Unchanged Vomiting:    Emesis appearance: Nonbloody nonbilious.   Number of occurrences:  10   Severity:  Severe   Progression:  Unchanged Risk factors: alcohol abuse and obesity   Risk factors: no aspirin use, has not had multiple surgeries and no NSAID use    He was seen here for the same a few days ago. At that time, lab workup was essentially unremarkable and he underwent CT scan that demonstrated no intra-abdominal abnormality. 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 He was prescribed Protonix. In the interim he has felt to have his prescription filled. He has continued to drink a sixpack of beer daily.  He's never been diagnosed with ulcer disease. Never seen a gastroenterologist. Never had pancreatitis. No  prior surgeries.  History reviewed. No pertinent past medical history. History reviewed. No pertinent past surgical history. Family History  Problem Relation Age of Onset  . Diabetes Mother    History  Substance Use Topics  . Smoking status: Current Some Day Smoker    Types: Cigarettes  . Smokeless tobacco: Not on file  . Alcohol Use: 29.4 oz/week    49 Cans of beer per week    Review of Systems  Constitutional: Negative for fever and chills.  HENT: Negative for congestion, rhinorrhea and sore throat.   Eyes: Negative for visual disturbance.  Respiratory: Negative for cough and shortness of breath.   Cardiovascular: Negative for chest pain and leg swelling.  Gastrointestinal: Positive for nausea, vomiting, abdominal pain and diarrhea. Negative for melena, hematochezia and hematemesis.  Genitourinary: Negative for dysuria, urgency, frequency, hematuria, flank pain and difficulty urinating.  Musculoskeletal: Negative for back pain, neck pain and neck stiffness.  Skin: Negative for rash.  Neurological: Negative for syncope, weakness, numbness and headaches.  All other systems reviewed and are negative.      Allergies  Review of patient's allergies indicates no known allergies.  Home Medications   Current Outpatient Rx  Name  Route  Sig  Dispense  Refill  . ondansetron (ZOFRAN ODT) 4 MG disintegrating tablet      4mg  ODT q4 hours prn nausea/vomit   4 tablet   0   . pantoprazole (PROTONIX) 20 MG tablet   Oral  Take 1 tablet (20 mg total) by mouth daily.   60 tablet   0    BP 106/59  Pulse 79  Temp(Src) 97.7 F (36.5 C) (Oral)  Resp 20  SpO2 100% Physical Exam  Nursing note and vitals reviewed. Constitutional: He is oriented to person, place, and time. He appears well-developed and well-nourished. No distress.  HENT:  Head: Normocephalic and atraumatic.  Mouth/Throat: Oropharynx is clear and moist.  Eyes: Conjunctivae and EOM are normal. Pupils are equal,  round, and reactive to light. No scleral icterus.  Neck: Normal range of motion. Neck supple. No JVD present.  Cardiovascular: Normal rate, regular rhythm, normal heart sounds and intact distal pulses.  Exam reveals no gallop and no friction rub.   No murmur heard. Pulmonary/Chest: Effort normal and breath sounds normal. No respiratory distress. He has no wheezes. He has no rales.  Abdominal: Soft. Normal appearance and bowel sounds are normal. He exhibits no distension and no ascites. There is tenderness in the epigastric area. There is no rigidity, no rebound, no guarding, no CVA tenderness and negative Murphy's sign. No hernia.  Musculoskeletal: He exhibits no edema.  Neurological: He is alert and oriented to person, place, and time. No cranial nerve deficit. He exhibits normal muscle tone. Coordination normal.  Skin: Skin is warm and dry. He is not diaphoretic.    ED Course  Procedures (including critical care time) Labs Review Labs Reviewed  CBC WITH DIFFERENTIAL - Abnormal; Notable for the following:    WBC 12.2 (*)    Hemoglobin 17.4 (*)    Neutro Abs 9.3 (*)    All other components within normal limits  COMPREHENSIVE METABOLIC PANEL - Abnormal; Notable for the following:    Glucose, Bld 106 (*)    Total Protein 8.5 (*)    Total Bilirubin <0.2 (*)    All other components within normal limits  URINALYSIS, ROUTINE W REFLEX MICROSCOPIC - Abnormal; Notable for the following:    APPearance HAZY (*)    All other components within normal limits  LIPASE, BLOOD  I-STAT TROPOININ, ED   Imaging Review Dg Abd Acute W/chest  04/27/2013   CLINICAL DATA:  Periumbilical abdominal pain, vomiting and diarrhea. History of smoking.  EXAM: ACUTE ABDOMEN SERIES (ABDOMEN 2 VIEW & CHEST 1 VIEW)  COMPARISON:  Chest and abdominal radiographs performed 04/14/2013, and CT of the abdomen and pelvis performed 04/14/2013  FINDINGS: The lungs are well-aerated and clear. There is no evidence of focal  opacification, pleural effusion or pneumothorax. The cardiomediastinal silhouette is within normal limits.  The visualized bowel gas pattern is unremarkable. Scattered stool and air are seen within the colon; there is no evidence of small bowel dilatation to suggest obstruction. No free intra-abdominal air is identified on the provided decubitus view. The stomach is largely decompressed, containing a small amount of air.  No acute osseous abnormalities are seen; the sacroiliac joints are unremarkable in appearance.  IMPRESSION: 1. Unremarkable bowel gas pattern; no free intra-abdominal air seen. 2. No acute cardiopulmonary process identified.   Electronically Signed   By: Roanna RaiderJeffery  Chang M.D.   On: 04/27/2013 21:46     EKG Interpretation None      MDM   23 year old male presents complaining of abdominal pain. Onset was one hour ago. Pain is severe located to his epigastrium. Associated with greater than 10 episodes of nonbloody nonbilious emesis and greater than 10 episodes of diarrhea. Patient is a daily heavy alcohol drinker. States he drinks about a  sixpack a day. He was here for the same about a week ago. At that time CT scan was negative and his lab workup was essentially unremarkable. He has no history of pancreatitis, gallbladder disease, valve structures, or any other significant medical history.   On exam he is afebrile, normal vital signs. He appears uncomfortable. Mucous membranes dry. He has tenderness to palpation in his epigastrium. Negative Murphy sign. No rebound or guarding. Abdomen is not tympanic.  Differential includes alcoholic gastritis versus pancreatitis versus less likely Boerhaave syndrome. I certainly says no blood although he was reporting that he had hematemesis last week when he was here. I do not suspect bowel obstruction, appendicitis, cholecystitis, or any other surgical process at this time given his benign abdominal exam. And I feel that imaging is wanted at this time.  We'll check labs with particular attention to lipase, give IV fluid bolus, Zofran, IV pain medication.  He was reevaluated several times during his emergency department course. He had no vomiting or diarrhea during his ED stay. His abdominal exam remains unchanged. He feels better after a dose of pain medication and nausea medication. Lab workup was remarkable for mild leukocytosis, normal, stable hemoglobin, normal lipase, normal metabolic panel. A troponin was sent from triage as part of the nursing protocol. I do not feel that this test was indicated but as part of a triage protocol and I do not suspect coronary artery disease, no chest pain.  Given his reassuring exam, normal lab workup, recent normal CT scan, and his history I do not suspect an emergent or surgical process. 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 He is discharged home with a new prescription for Protonix really had long discussion about importance of alcohol cessation, compliance with his medication, and primary care physician followup. I advised that he establish care with primary care physician and have test for H. pylori and he probably will need to see a gastroenterologist for further evaluation if this is an ongoing problem.   Final diagnoses:  Abdominal pain       Toney Sang, MD 04/28/13 [phone]

## 2013-08-10 ENCOUNTER — Emergency Department (HOSPITAL_COMMUNITY): Payer: Self-pay

## 2013-08-10 ENCOUNTER — Encounter (HOSPITAL_COMMUNITY): Payer: Self-pay | Admitting: Emergency Medicine

## 2013-08-10 ENCOUNTER — Emergency Department (HOSPITAL_COMMUNITY)
Admission: EM | Admit: 2013-08-10 | Discharge: 2013-08-10 | Disposition: A | Discharge status: Home / Self Care | Payer: Self-pay | Attending: Emergency Medicine | Admitting: Emergency Medicine

## 2013-08-10 DIAGNOSIS — F1010 Alcohol abuse, uncomplicated: Secondary | ICD-10-CM

## 2013-08-10 DIAGNOSIS — F17200 Nicotine dependence, unspecified, uncomplicated: Secondary | ICD-10-CM | POA: Insufficient documentation

## 2013-08-10 DIAGNOSIS — F411 Generalized anxiety disorder: Secondary | ICD-10-CM | POA: Insufficient documentation

## 2013-08-10 DIAGNOSIS — F419 Anxiety disorder, unspecified: Secondary | ICD-10-CM

## 2013-08-10 DIAGNOSIS — R51 Headache: Secondary | ICD-10-CM | POA: Insufficient documentation

## 2013-08-10 DIAGNOSIS — R079 Chest pain, unspecified: Secondary | ICD-10-CM

## 2013-08-10 DIAGNOSIS — R42 Dizziness and giddiness: Secondary | ICD-10-CM | POA: Insufficient documentation

## 2013-08-10 DIAGNOSIS — R0602 Shortness of breath: Secondary | ICD-10-CM | POA: Insufficient documentation

## 2013-08-10 LAB — I-STAT TROPONIN, ED: Troponin i, poc: 0 ng/mL (ref 0.00–0.08)

## 2013-08-10 LAB — CBC WITH DIFFERENTIAL/PLATELET
BASOS ABS: 0 10*3/uL (ref 0.0–0.1)
Basophils Relative: 0 % (ref 0–1)
EOS PCT: 12 % — AB (ref 0–5)
Eosinophils Absolute: 1.3 10*3/uL — ABNORMAL HIGH (ref 0.0–0.7)
HCT: 45.7 % (ref 39.0–52.0)
Hemoglobin: 15.8 g/dL (ref 13.0–17.0)
LYMPHS ABS: 2.5 10*3/uL (ref 0.7–4.0)
LYMPHS PCT: 23 % (ref 12–46)
MCH: 30.9 pg (ref 26.0–34.0)
MCHC: 34.6 g/dL (ref 30.0–36.0)
MCV: 89.3 fL (ref 78.0–100.0)
Monocytes Absolute: 0.7 10*3/uL (ref 0.1–1.0)
Monocytes Relative: 6 % (ref 3–12)
NEUTROS PCT: 59 % (ref 43–77)
Neutro Abs: 6.4 10*3/uL (ref 1.7–7.7)
PLATELETS: 275 10*3/uL (ref 150–400)
RBC: 5.12 MIL/uL (ref 4.22–5.81)
RDW: 12.3 % (ref 11.5–15.5)
WBC: 11 10*3/uL — AB (ref 4.0–10.5)

## 2013-08-10 LAB — BASIC METABOLIC PANEL
ANION GAP: 16 — AB (ref 5–15)
BUN: 13 mg/dL (ref 6–23)
CALCIUM: 9.8 mg/dL (ref 8.4–10.5)
CO2: 22 meq/L (ref 19–32)
Chloride: 100 mEq/L (ref 96–112)
Creatinine, Ser: 1.01 mg/dL (ref 0.50–1.35)
GFR calc Af Amer: >90 mL/min (ref >90)
GLUCOSE: 107 mg/dL — AB (ref 70–99)
Potassium: 4 mEq/L (ref 3.7–5.3)
SODIUM: 138 meq/L (ref 137–147)

## 2013-08-10 LAB — TROPONIN I

## 2013-08-10 MED ORDER — GI COCKTAIL ~~LOC~~
30.0000 mL | Freq: Once | ORAL | Status: AC
  Administered 2013-08-10: 30 mL via ORAL
  Filled 2013-08-10: qty 30

## 2013-08-10 MED ORDER — HYDROXYZINE HCL 25 MG PO TABS
50.0000 mg | ORAL_TABLET | Freq: Once | ORAL | Status: AC
  Administered 2013-08-10: 50 mg via ORAL
  Filled 2013-08-10: qty 2

## 2013-08-10 MED ORDER — SODIUM CHLORIDE 0.9 % IV BOLUS (SEPSIS)
500.0000 mL | Freq: Once | INTRAVENOUS | Status: AC
  Administered 2013-08-10: 500 mL via INTRAVENOUS

## 2013-08-10 MED ORDER — HYDROXYZINE HCL 25 MG PO TABS: 25.0000 mg | ORAL_TABLET | ORAL | Status: AC | PRN

## 2013-08-10 MED ORDER — LORAZEPAM 2 MG/ML IJ SOLN
1.0000 mg | Freq: Once | INTRAMUSCULAR | Status: AC
  Administered 2013-08-10: 1 mg via INTRAVENOUS
  Filled 2013-08-10: qty 1

## 2013-08-10 NOTE — ED Notes (Addendum)
Per EMS, pt used cocaine and drank alcohol last night and states he began feeling chest pain, dizziness, and shortness of breath while walking to the store 2 hours ago. Pt has hx of anxiety, and states this was his first time smoking cocaine.

## 2013-08-10 NOTE — ED Provider Notes (Signed)
CSN: [id number]     Arrival date & time 08/10/13  1424 History   First MD Initiated Contact with Patient 08/10/13 1456     Chief Complaint  Patient presents with  . Anxiety  . Chest Pain  . Drug Problem     (Consider location/radiation/quality/duration/timing/severity/associated sxs/prior Treatment) HPI Comments: Patient presents to the ER for duration of headache, chest pain and anxiety. Patient feels short of breath, weak and dizzy. Patient reports that symptoms began approximately 2 hours ago. He admits to drinking alcohol heavily last night and smoking crack for the first time. Patient's headache is global, moderate and throbbing. This pain is sharp, central. He is feeling extremely anxious at this time. He does have a history of anxiety and panic attacks.  Patient is a 23 y.o. male presenting with anxiety, chest pain, and drug problem.  Anxiety Associated symptoms include chest pain, headaches and shortness of breath.  Chest Pain Associated symptoms: anxiety, dizziness, headache and shortness of breath   Drug Problem Associated symptoms include chest pain, headaches and shortness of breath.    History reviewed. No pertinent past medical history. History reviewed. No pertinent past surgical history. Family History  Problem Relation Age of Onset  . Diabetes Mother    History  Substance Use Topics  . Smoking status: Current Some Day Smoker    Types: Cigarettes  . Smokeless tobacco: Not on file  . Alcohol Use: 29.4 oz/week    49 Cans of beer per week    Review of Systems  Respiratory: Positive for shortness of breath.   Cardiovascular: Positive for chest pain.  Neurological: Positive for dizziness and headaches.  Psychiatric/Behavioral: The patient is nervous/anxious.   All other systems reviewed and are negative.     Allergies  Review of patient's allergies indicates no known allergies.  Home Medications   Prior to Admission medications   Medication Sig Start  Date End Date Taking? Authorizing Provider  acetaminophen (TYLENOL) 325 MG tablet Take 650 mg by mouth every 6 (six) hours as needed (pain).   Yes Historical Provider, MD   BP 124/65  Pulse 88  Temp(Src) 98.3 F (36.8 C) (Oral)  Resp 16  SpO2 100% Physical Exam  Constitutional: He is oriented to person, place, and time. He appears well-developed and well-nourished. No distress.  HENT:  Head: Normocephalic and atraumatic.  Right Ear: Hearing normal.  Left Ear: Hearing normal.  Nose: Nose normal.  Mouth/Throat: Oropharynx is clear and moist and mucous membranes are normal.  Eyes: Conjunctivae and EOM are normal. Pupils are equal, round, and reactive to light.  Neck: Normal range of motion. Neck supple.  Cardiovascular: Regular rhythm, S1 normal and S2 normal.  Exam reveals no gallop and no friction rub.   No murmur heard. Pulmonary/Chest: Effort normal and breath sounds normal. No respiratory distress. He exhibits no tenderness.  Abdominal: Soft. Normal appearance and bowel sounds are normal. There is no hepatosplenomegaly. There is no tenderness. There is no rebound, no guarding, no tenderness at McBurney's point and negative Murphy's sign. No hernia.  Musculoskeletal: Normal range of motion.  Neurological: He is alert and oriented to person, place, and time. He has normal strength. No cranial nerve deficit or sensory deficit. Coordination normal. GCS eye subscore is 4. GCS verbal subscore is 5. GCS motor subscore is 6.  Skin: Skin is warm, dry and intact. No rash noted. No cyanosis.  Psychiatric: His speech is normal and behavior is normal. Thought content normal. His mood appears anxious.  ED Course  Procedures (including critical care time) Labs Review Labs Reviewed  CBC WITH DIFFERENTIAL  BASIC METABOLIC PANEL  TROPONIN I  Rosezena Sensor, ED    Imaging Review Dg Chest 2 View  08/10/2013   CLINICAL DATA:  Smoker with chest pain.  EXAM: CHEST  2 VIEW  COMPARISON:  One  view chest x-ray 04/27/2013, 04/14/2013. Two-view chest x-ray 09/16/2012, 09/12/2012.  FINDINGS: Cardiomediastinal silhouette unremarkable and unchanged. Lungs clear. Bronchovascular markings normal. Pulmonary vascularity normal. No visible pleural effusions. No pneumothorax. Visualized bony thorax intact. No significant interval change.  IMPRESSION: No acute cardiopulmonary disease.  Stable examination.   Electronically Signed   By: Hulan Saas M.D.   On: 08/10/2013 15:59     EKG Interpretation   Date/Time:  Sunday August 10 2013 14:39:07 EDT Ventricular Rate:  79 PR Interval:  121 QRS Duration: 79 QT Interval:  332 QTC Calculation: 380 R Axis:   82 Text Interpretation:  Sinus rhythm Borderline repolarization abnormality  Borderline ST elevation, anterior leads No significant change since last  tracing Confirmed by Aylanie Cubillos  MD, Keshawn Sundberg [id number]) on 08/10/2013 3:29:22  PM      MDM   Final diagnoses:  None   alcohol abuse  Chest pain  Patient presented to the ER for evaluation of anxiety and chest pain. Patient admits to drinking heavily last night and also using crack cocaine for the first time. His external anxious upon arrival. He was administered IV fluids and Ativan with improvement of his anxiety. His EKG has nonspecific changes, but this is a chronically identical to previous EKGs. No acute change. Troponin was negative. This is cardiac in nature. His only cardiac risk factor is the cocaine use, which he used for the first time 10-12 hours before onset of the pain.  Patient admits to alcohol problems. He does not wish to have inpatient detox. He would like outpatient resources and treatment for his anxiety. With his history of drug and alcohol problems, I do not feel that he is a candidate for benzodiazepines for his anxiety. Patient will be checked given a prescription for hydroxyzine and outpatient resources, return if his symptoms worsen.    Gilda Crease,  MD 08/10/13 [phone]

## 2013-08-10 NOTE — ED Notes (Signed)
Bed: ZO10WA04 Expected date:  Expected time:  Means of arrival:  Comments: Anxiety/hyperventillating/tachy.

## 2013-08-10 NOTE — Discharge Instructions (Signed)
Problemas Con El Alcohol (Alcohol Problems) La mayora de los adultos que beben alcohol lo hacen con moderacin (no demasiado) y poseen bajo riesgo de Warehouse manager problemas relacionados con la bebida. Sin embargo, todos los bebedores, inclusive los de bajo riesgo, Gaffer los riesgos de la salud que conlleva la ingesta de alcohol. RECOMENDACIONES PARA LOS BEBEDORES DE BAJO RIESGO Beber con moderacin. La ingesta moderada de alcohol se establece de la siguiente manera:   Hombres - no ms de dos tragos Google.  Mujeres - no ms de un trago Air cabin crew.  Mayores de 65 aos - no ms de un trago Air cabin crew. Un trago estndar es 12 gramos de alcohol puro, lo que es lo mismo que una botella de 12 onzas de cerveza o Merchant navy officer, un vaso de vino de 5 onzas, o 1 onza y media de bebidas blancas (como whisky, brandy, vodka, o ron).  ABSTNGASE (NO BEBA) ALCOHOL:  Si est embarazada o contempla la posibilidad.  Cuando tome un medicamento que interacta con el alcohol.  Si usted es dependiente del alcohol.  Sufre alguna enfermedad en la que se prohba el consumo de alcohol (como lceras, enfermedades hepticas, o enfermedades cardacas). HABLE CON EL MDICO:  Si tiene riesgo de sufrir una enfermedad coronaria, converse sobre los potenciales beneficios y riesgos de la ingesta de alcohol: La ingesta baja a moderada de alcohol est asociada con tasas menores de enfermedades coronarias en ciertas poblaciones (por ejemplo, hombres mayores de 45 aos y mujeres postmenopusicas). Se aconseja a las personas no bebedoras o abstemias no comenzar con la ingesta baja a moderada para reducir el riesgo de enfermedades coronarias en funcin de evitar la aparicin de un problema relacionado con el alcohol. Pueden obtenerse efectos protectores similares a travs de una dieta y ejercicios adecuados.  Las mujeres y los ancianos tienen menos cantidad de agua en el [employer]. Como consecuencia de [place], las mujeres y  los ancianos tienen mayor concentracin de alcohol en la sangre despus de beber la misma cantidad de alcohol.  La exposicin del feto al alcohol puede ocasionar una gran cantidad de defectos de nacimientos dominados [place] Alcohlico Fetal (FAS) o Defectos de Nacimiento Relacionados con el Alcohol (ARBD). Aunque los FAS y los ARBD estn asociados con el consumo excesivo de alcohol durante el [place], tambin se han informado trastornos de Leisure centre manager en nios nacidos de madres que informaron haber bebido un trago en promedio por Mudlogger.  El abuso de alcohol (como el consumo de ms de cuatro tragos por ocasin en hombres y ms de tres tragos por ocasin en mujeres) perjudica a lo cognitivo (aprendizaje) y las funciones psicomotoras e incrementa el riesgo de problemas relacionados con el alcohol, inclusive accidentes y daos. PREGUNTAS CECA:   Algunas vez ha sentido que deba cortar con la bebida?  Se ha enojado usted con alguien que lo critic por lo que bebe?  Alguna vez se ha sentido mal o culpable por lo que bebe?  Ha tomado alguna vez un trago por la maana para calmar sus nervios o para deshacerse de una "resaca" (para "abrir los ojos")? Si ha respondido de [place] positiva a [place] de estas preguntas: Podra estar en riesgo de tener problemas relacionados con el alcohol si el consumo del mismo es:   Hombres: Mayor a 14 tragos por semana o ms de 4 tragos por ocasin.  Mujeres: Mayor a 7 tragos por semana o ms de 3 tragos por ocasin. Tiene usted o su familia  alguna historia clnica de problemas relacionados con el alcohol como:  Prdida del conocimiento.  Disfuncin sexual.  Depresin.  Traumatismos.  Enfermedades hepticas.  Trastornos del sueo.  Hipertensin arterial.  Dolor crnico abdominal.  Alguna vez el beber le ha ocasionado problemas, como por ejemplo con su familia, en su rendimiento laboral (o escolar), accidentes o lesiones?  Ha tenido  una compulsin a beber o se ha preocupado mientras lo haca?  Posee poco control o es incapaz de parar de beber una vez que ha comenzado?  Ha tenido que beber para evitar sntomas de abstinencia?  Ha tenido problemas con la abstinencia como temblores, nuseas, sudor o cambios en el humor?  Necesita ms alcohol que antes para emborracharse?  Siente una fuerte necesidad de beber?  Cambia de planes para poder beber?  Alguna vez ha bebido en la maana para aliviar temblores o resaca? Si ha respondido a [place] de estas preguntas de [employer], puede que sea el momento de hablar con un profesional, familiar o amigos y ver si ellos creen que tiene un problema. El alcoholismo es una dependencia qumica que puede Theme park manager y Environmental health practitioner a destruir su salud y Teacher, English as a foreign language. Muchos alcohlicos mueren, se empobrecen o terminan en prisin. Esto es a menudo el resultado de una dependencia qumica.  No se desaliente si no est listo para actuar inmediatamente.  Las decisiones para cambiar su comportamiento a menudo implican altas y bajas entre el deseo de [place] y la sensacin de que no puede decidirse.  Intente pensar ms seriamente sobre su comportamiento frente a la bebida.  Piense en razones para dejarla. PARA OBTENER INFORMACIN ADICIONAL, CONCURRIR A:  The [employer] on Alcohol Abuse and Alcoholism (NIAAA) BasicStudents.dk   [employer] on Alcoholism and Drug Dependence (NCADD) www.ncadd.org  American Society of Addiction Medicine (ASAM) RoyalDiary.gl  Document Released: 04/18/2007 Document Revised: 04/03/2011 [facility] Patient Information 2015 [place], [place]. This information is not intended to replace advice given to you by your health care provider. Make sure you discuss any questions you have with your health care provider.  Dolor de pecho (no especfico) (Chest Pain (Nonspecific)) Suele ser difcil diagnosticar la causa del dolor de [place]. Siempre hay una  posibilidad de que el dolor podra estar relacionado con algo grave, como un ataque al corazn o un cogulo sanguneo en los pulmones. Debe concurrir a las visitas de control con el mdico. CUIDADOS EN EL HOGAR  Si le dieron antibiticos, tmelos como se lo haya indicado el mdico. Finalice el medicamento, aunque comience a Actor.  [address], no haga actividades que provoquen dolor de [place]. Contine con las actividades fsicas como se lo haya indicado el mdico.  No use productos que contengan tabaco, que incluyen cigarrillos, tabaco para Theatre manager y Administrator, Civil Service.  Evite el consumo de alcohol.  Tome los medicamentos solamente como se lo haya indicado el mdico.  Siga las sugerencias del mdico en lo que respecta a ms pruebas, si el dolor de pecho no desaparece.  Concurra a todas las visitas que concert con el mdico. SOLICITE AYUDA SI:  El dolor de pecho no desaparece, incluso despus del tratamiento.  Tiene una erupcin cutnea con ampollas en el pecho.  Tiene fiebre. SOLICITE AYUDA DE INMEDIATO SI:   Aumenta el dolor de pecho o el dolor se irradia hacia el brazo, el cuello, la [place], la espalda o el vientre (abdomen).  Le falta el aire.  Tose ms de lo normal o tose con sangre.  Siente un  dolor muy intenso en la espalda o el vientre.  Tiene malestar estomacal (nuseas) o vomita.  Se siente muy dbil.  Pierde el conocimiento (se desmaya).  Tiene escalofros. Esto es Radio broadcast assistant. No espere a ver que los problemas desaparezcan. Llame a los servicios de emergencia locales (911 en los [place]). No conduzca por sus propios medios [employer] hospital. ASEGRESE DE QUE:   Comprende estas instrucciones.  Controlar su afeccin.  Recibir ayuda de inmediato si no mejora o si empeora. Document Released: 04/07/2008 Document Revised: 01/14/2013 [facility] Patient Information 2015 [place], [place]. This information is not intended  to replace advice given to you by your health care provider. Make sure you discuss any questions you have with your health care provider. Generalized Anxiety Disorder Generalized anxiety disorder (GAD) is a mental disorder. It interferes with life functions, including relationships, work, and school. GAD is different from normal anxiety, which everyone experiences at some point in their lives in response to specific life events and activities. Normal anxiety actually helps [place] prepare for and get through these life events and activities. Normal anxiety goes away after the event or activity is over.  GAD causes anxiety that is not necessarily related to specific events or activities. It also causes excess anxiety in proportion to specific events or activities. The anxiety associated with GAD is also difficult to control. GAD can vary from mild to severe. People with severe GAD can have intense waves of anxiety with physical symptoms (panic attacks).  SYMPTOMS The anxiety and worry associated with GAD are difficult to control. This anxiety and worry are related to many life events and activities and also occur more days than not for 6 months or longer. People with GAD also have three or more of the following symptoms (one or more in children):  Restlessness.   Fatigue.  Difficulty concentrating.   Irritability.  Muscle tension.  Difficulty sleeping or unsatisfying sleep. DIAGNOSIS GAD is diagnosed through an assessment by your caregiver. Your caregiver will ask you questions aboutyour mood,physical symptoms, and events in your life. Your caregiver may ask you about your medical history and use of alcohol or drugs, including prescription medications. Your caregiver may also do a physical exam and blood tests. Certain medical conditions and the use of certain substances can cause symptoms similar to those associated with GAD. Your caregiver may refer you to a mental health specialist for further  evaluation. TREATMENT The following therapies are usually used to treat GAD:   Medication--Antidepressant medication usually is prescribed for long-term daily control. Antianxiety medications may be added in severe cases, especially when panic attacks occur.   Talk therapy (psychotherapy)--Certain types of talk therapy can be helpful in treating GAD by providing support, education, and guidance. A form of talk therapy called cognitive behavioral therapy can teach you healthy ways to think about and react to daily life events and activities.  Stress managementtechniques--These include yoga, meditation, and exercise and can be very helpful when they are practiced regularly. A mental health specialist can help determine which treatment is best for you. Some people see improvement with one therapy. However, other people require a combination of therapies. Document Released: 05/06/2012 Document Reviewed: 05/06/2012 [facility] Patient Information 2015 [place], [place]. This information is not intended to replace advice given to you by your health care provider. Make sure you discuss any questions you have with your health care provider.   Emergency Department Resource Guide 1) Find a Doctor and Pay Out of Pocket Although you won't  have to find out who is covered by your insurance plan, it is a good idea to ask around and get recommendations. You will then need to call the office and see if the doctor you have chosen will accept you as a new patient and what types of options they offer for patients who are self-pay. Some doctors offer discounts or will set up payment plans for their patients who do not have insurance, but you will need to ask so you aren't surprised when you get to your appointment.  2) Contact Your Local Health Department Not all health departments have doctors that can see patients for sick visits, but many do, so it is worth a call to see if yours does. If you don't know where your  local health department is, you can check in your phone book. The CDC also has a tool to help you locate your state's health department, and many state websites also have listings of all of their local health departments.  3) Find a Walk-in Clinic If your illness is not likely to be very severe or complicated, you may want to try a walk in clinic. These are popping up all over the country in pharmacies, drugstores, and shopping centers. They're usually staffed by nurse practitioners or physician assistants that have been trained to treat common illnesses and complaints. They're usually fairly quick and inexpensive. However, if you have serious medical issues or chronic medical problems, these are probably not your best option.  No Primary Care Doctor: - Call Health Connect at  [phone] - they can help you locate a primary care doctor that  accepts your insurance, provides certain services, etc. - Physician Referral Service- [phone]  Chronic Pain Problems: Organization         Address  Phone   Notes  [facility]  [phone] Patients need to be referred by their primary care doctor.   Medication Assistance: Organization         Address  Phone   Notes  [facility] Medication [facility] [address] [place] [place], [place] [address] [phone] --Must be a resident of [facility] -- Must have NO insurance coverage whatsoever (no Medicaid/ Medicare, etc.) -- The pt. MUST have a primary care doctor that directs their care regularly and follows them in the community   MedAssist  [phone]   [employer]  [phone]    Agencies that provide inexpensive medical care: Organization         Address  Phone   Notes  [facility]  [phone]   [facility]    [phone]   [facility] [address] [place], [place] [address] [phone]   [facility] [address] [place].  [address] [place] [phone]   Planned Parenthood    [phone]   [facility]    [phone]   [facility]  [address], [place] Phone:  [phone], Fax:  [phone] Hours of Operation:  9 am - 6 pm, M-F.  Also accepts Medicaid/Medicare and self-pay.  [facility] for [facility]  [address], [place] Phone: [phone], Fax: [phone]. Hours of Operation:  8:30 am - 5:30 pm, M-F.  Also accepts Medicaid and self-pay.  [facility] [place] [address], [employer] Phone: [phone]   [facility] [address], [place], [place] [phone], Ext. [phone] Mondays & Thursdays: 7-9 AM.  First 15 patients are seen on a first come, first serve basis.    Medicaid-accepting [facility] Providers:  Organization         Address  Phone   Notes  [facility] [address], [place] [phone] Also accepts self-pay patients.  [facility] [address], [place]  [phone](336) [phone]   [facility] [place] [facility] [address], [place] [phone]   [facility] [address] [place] [address], [place] [phone]   Renaye RakersVeita Bland [address], [place]   [phone] Only accepts [place] Access [place]icaid patients after they have their name applied to their card.   Self-Pay (no insurance) in [facility]:  Organization         Address  Phone   Notes  Sickle Cell Patients, [facility] [address], [place] [phone]) [phone]   [facility] [address], [place] [phone]) [phone]   [facility] [place]  [address], [place] [phone]   [facility]/Dr. Osei-Bonsu  [address] [place] [address], [place] or [phone], [place] [phone] Phone number for both [place] and [place] locations is the same.  [facility] [address], [place] [phone]   [facility] [place] [address] [place] [address], [place] or [address] [phone]   [facility] [address], [place] [phone], phone; [phone](336) [phone], fax Sees patients 1st and 3rd Saturday of every month.  Must not qualify for public or private insurance (i.e. Medicaid, Medicare, [place] Health Choice, Veterans' Benefits)  Household income should be no more than 200% of the poverty level The clinic cannot treat you if you are pregnant or think you are pregnant  Sexually transmitted diseases are not treated at the clinic.    Dental Care: Organization         Address  Phone  Notes  [facility] [address], [place] [phone]) [phone] Accepts children up to age 25 who are enrolled in [place]icaid or [place] Health Choice; pregnant women with a Medicaid card; and children who have applied for Medicaid or [place] Health Choice, but were declined, whose parents can pay a reduced fee at time of service.  [facility] [place]  [address], [place] [phone] Accepts children up to age 25 who are enrolled in [place]icaid or [place] Health Choice; pregnant women with a Medicaid card; and children who have applied for Medicaid or [place] Health Choice, but were declined, whose parents can pay a reduced fee at time of service.  [facility]  [address], [place] [phone] Patients are seen by appointment only. Walk-ins are not accepted. [facility] will see patients 25 years of age and older. Monday - Tuesday (8am-5pm) Most Wednesdays (8:30-5pm) $30 per visit, cash only  [facility]  [address], [facility] [phone] Patients are seen by appointment only. Walk-ins are not  accepted. [facility] will see patients 25 years of age and older. One Wednesday Evening (Monthly: Volunteer Based).  $30 per visit, cash only  [employer] [facility]  [phone] for adults; Children under age 71, call Graduate Pediatric Dentistry at [phone]. Children aged 6-14, please call [phone] to request a pediatric application.  Dental services are provided in all areas of dental care including fillings, crowns and bridges, complete and partial dentures, implants, gum treatment, root canals, and extractions. Preventive care is also provided. Treatment is provided to both adults and children. Patients are selected via a lottery and there is often a waiting list.   [facility] [address] [place]  [phone] [email or web address]   [facility] [address] [place], [place] [phone], Ext. [phone] Second and Fourth Thursday of each month, opens at 6:30 AM; Clinic ends at 9 AM.  Patients are seen on a first-come first-served basis, and a limited number are seen during each clinic.   [facility]  [address] [place], [place] [phone]   Eligibility Requirements You must have lived in [place], [place], or [place] counties for at least the last three months.   You cannot be eligible for state or federal sponsored [place], including [employer], [place], or [employer].   You generally cannot be eligible for healthcare insurance through your employer.    How to apply: Eligibility screenings are held every Tuesday and Wednesday afternoon from 1:00 pm until 4:00 pm. You do not need an appointment for the interview!  [facility] [address] [place], [place] [phone]   [facility] Health Department  [phone]   [facility] [place] [facility] Health Department  [phone]   [facility] Health Department  [phone]    Behavioral Health Resources in the  Community: Intensive Outpatient Programs Organization         Address  Phone  Notes  [facility] [address], [place], [place] [phone]   [facility] Outpatient [address], [place], [place] [phone]   [facility] [address], [place], [place]  [phone]   [facility] [address]  [place], [place] [phone] or [phone]   Substance Abuse Resources Organization         Address  Phone  Notes  [facility] Drug Services  [phone]   [facility]  [phone]   [facility] [place]  [phone]   Floydene Flock  [phone]   Residential & Outpatient Substance Abuse Program  [phone]   Psychological Services Organization         Address  Phone  Notes  [facility]  [phone]   [facility]  [phone]   [facility] Mental Health [address] [place] [phone] or [phone]    Mobile Crisis Teams Organization         Address  Phone  Notes  [facility], Mobile Crisis Care Unit  [phone]   [facility]  [address] [place], [place] [phone]   Doristine Locks [address] [place] [phone]    Self-Help/Support Groups Organization         Address  Phone             Notes  [facility] [place] - variety of support groups  [phone]- [phone] Call for more information  Narcotics Anonymous (NA), [facility] [address], [employer] U[place]es  2 meetings at this location   [employer]  Phone  Notes  [facility] [address]    [place]  [phone]   [facility]  [address], [place] [address], [place], [place] [phone]   [facility] [address] [place], [place] [phone] Admissions: 8am-3pm M-F  [facility] [address],    [place], [place] [phone]   [facility] [address] [place], [place], [place] [phone]   [facility] [address],  [place], [place] [phone]   [facility] - Intensive Outpatient [address], [place], [place] [phone]   [facility] ([facility] [address]  [place], [place] [phone] or [phone]   [facility] ([facility]) [address], [place], [place] [phone] Accepts Medicaid  [facility] [place] [address] [place] [address]  [place] [phone] Substance Abuse/Addiction Treatment   [facility] Organization         Address  Phone  Notes  [facility]  [phone]   Angie Fava, PhD 9 [address] Ervin Knack [place], [place]   [phone] or [phone]   [facility]   [address] [place], [place] [phone]   [facility] [address] [place], [place] [phone] Insurance/Medicaid/sponsorship through [facility], [place] and Families [address], [address]                                    [place], [place] [phone] Therapy/tele-psych/case  [facility] [address], [place] [phone]    Dr. Lolly Mustache  [phone]   [facility]  [employer] [facility] 1) [address] [place] 2) [address] [place] 3)  [address], [place] [phone]  [phone]   [facility] Child Abuse Hotline [phone] or [phone] (After Hours)

## 2015-07-24 IMAGING — CR DG ABDOMEN ACUTE W/ 1V CHEST
3 series · 3 of 3 positions shown · non-contrast
Comparison: Chest x-ray dated 09/16/2012

CLINICAL DATA: Chest and abdominal pain.  Hematemesis.

EXAM:
ACUTE ABDOMEN SERIES (ABDOMEN 2 VIEW & CHEST 1 VIEW)

[w chest pa]
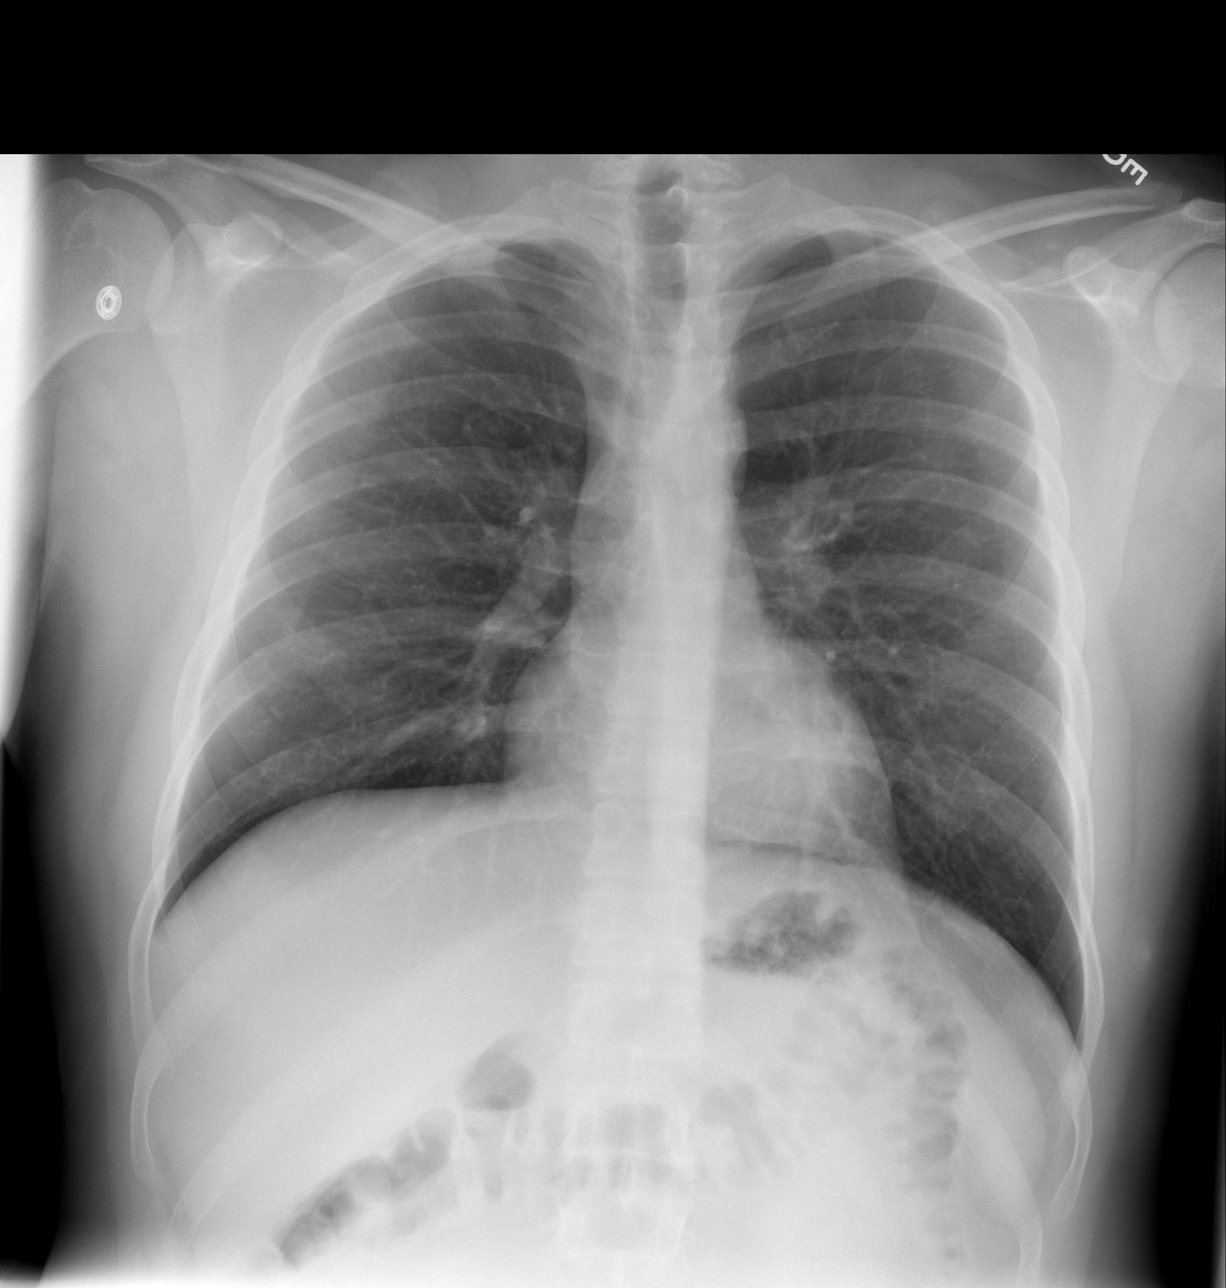

[w abdomen upright]
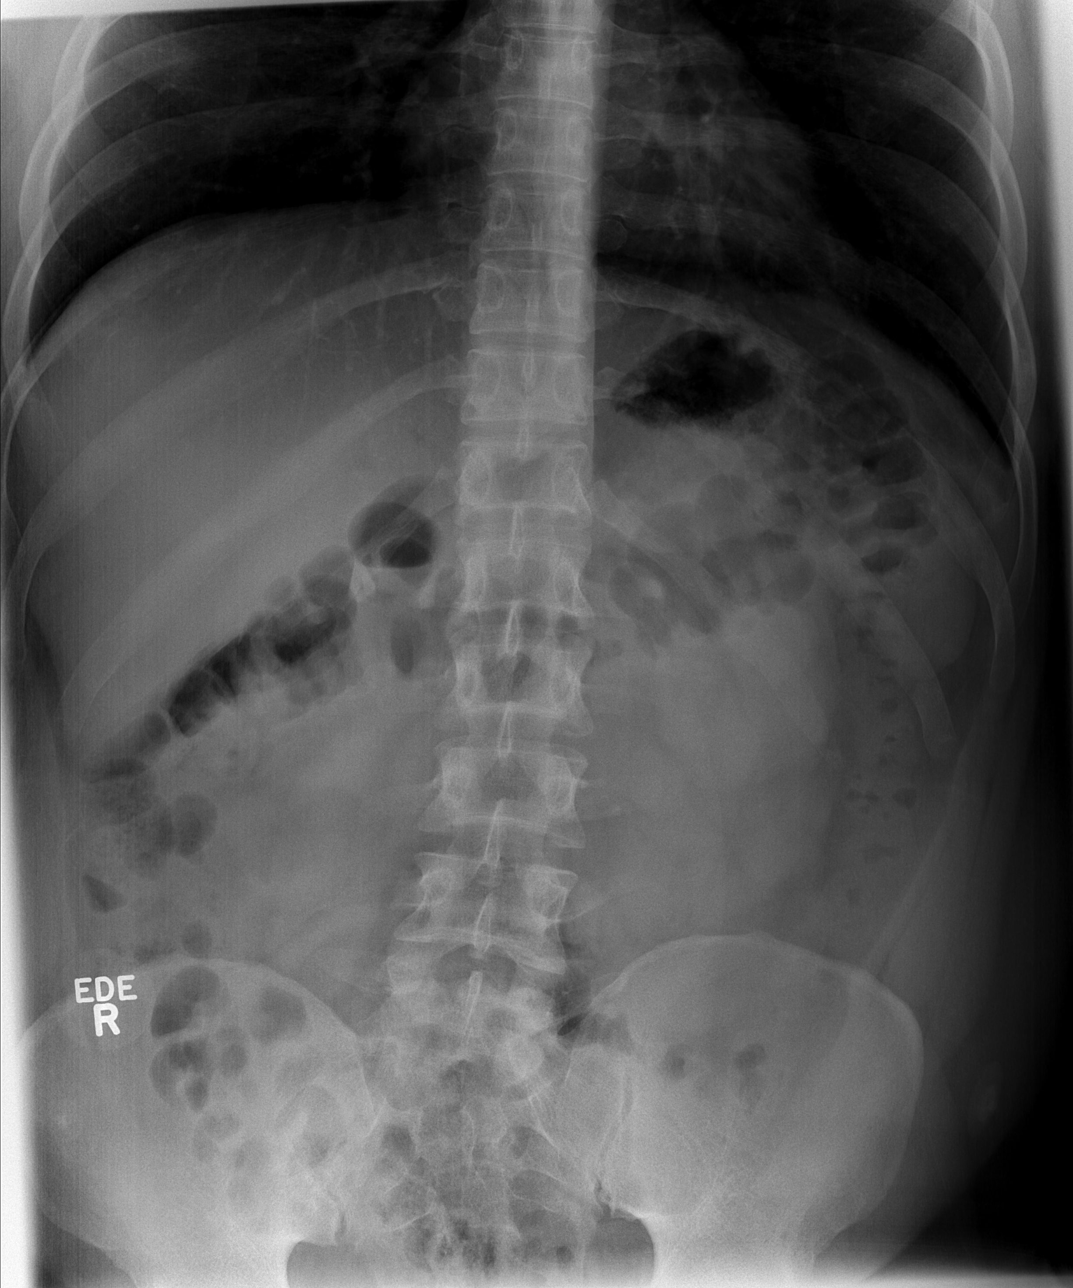

[t abdomen supine]
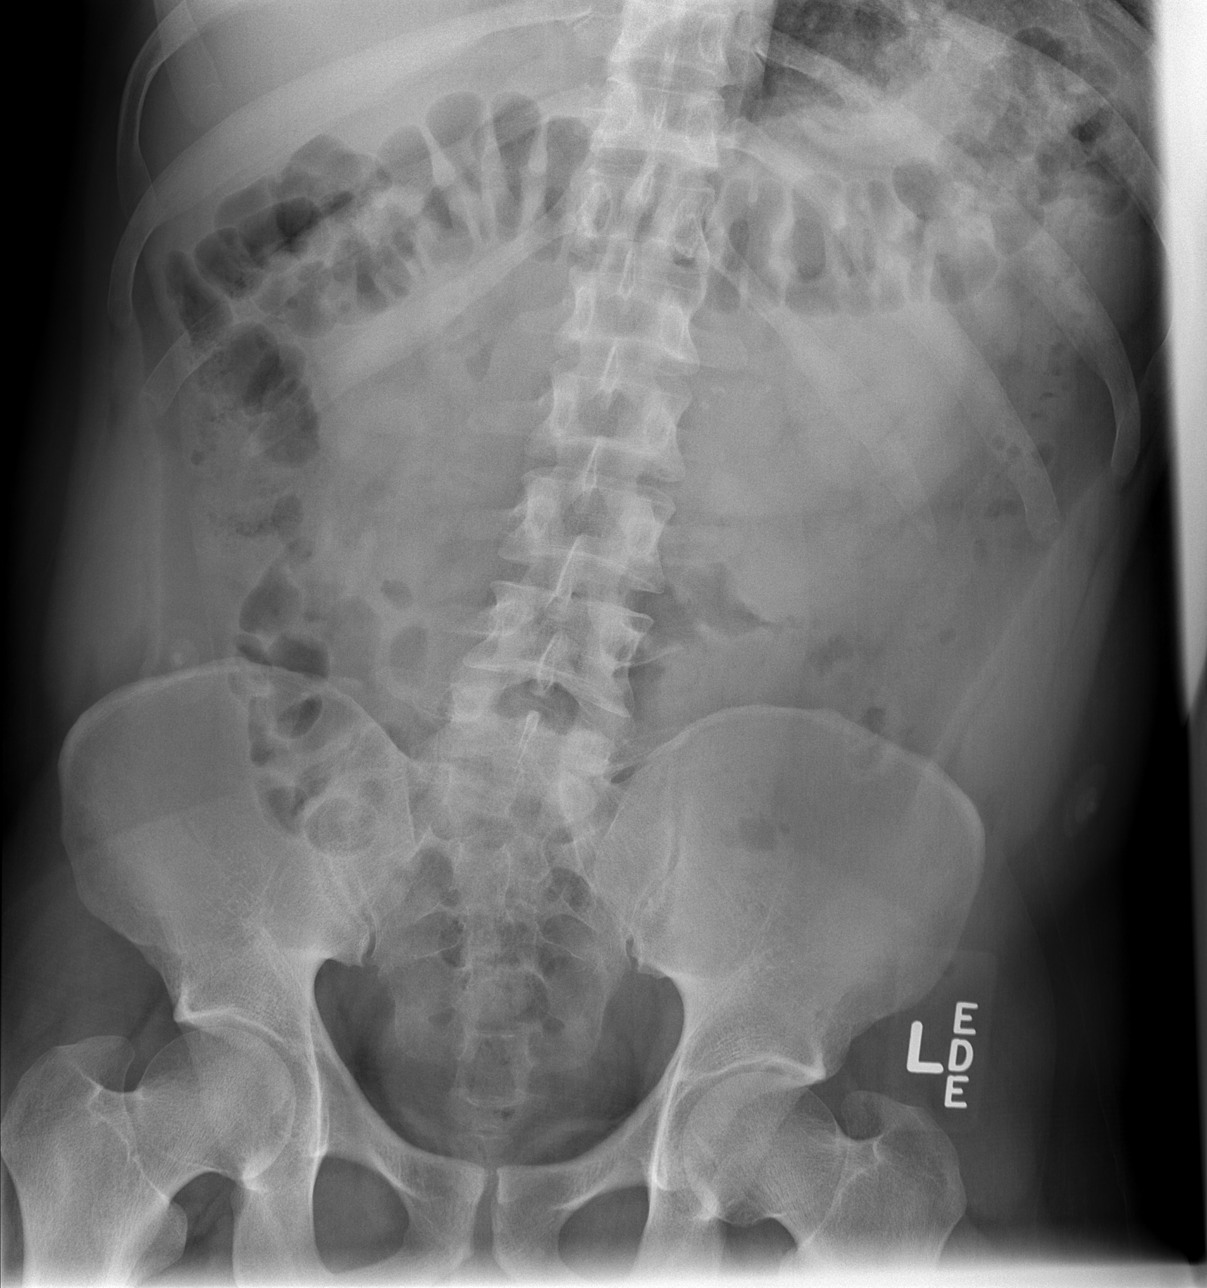

[3 of 3 positions shown; findings below may reference images not displayed]

FINDINGS: Heart and lungs appear normal. No free air or free fluid in the
abdomen. The bowel gas pattern is normal. Faint calcifications are
seen in the mid abdomen, possibly within the pancreas.

No osseous abnormality.
IMPRESSION: No acute abnormalities. Calcifications in the mid abdomen may
represent calcific pancreatitis.

## 2015-07-24 IMAGING — CT CT ABD-PELV W/ CM
2 of 4 series · 16 of 46 positions shown, 18 images · IV contrast (omnipaque)
Comparison: None.

CLINICAL DATA: Diffuse abdominal pain

EXAM:
CT ABDOMEN AND PELVIS WITH CONTRAST
TECHNIQUE: Multidetector CT imaging of the abdomen and pelvis was performed
using the standard protocol following bolus administration of
intravenous contrast.
CONTRAST:  80mL OMNIPAQUE IOHEXOL 300 MG/ML  SOLN

[Series 2: abd/ pelvis 5.0 i30f 1 · axial · 0.67mm/px · z∈[+904,+1339]mm · 13 of 95 slices shown, 15 images]
[im 4/95  soft-tissue]
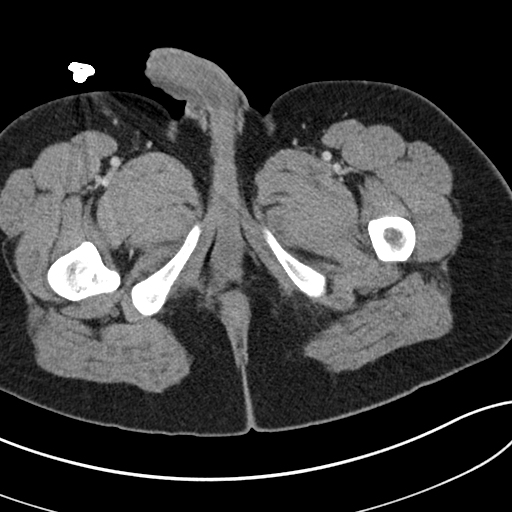
[im 4/95  bone]
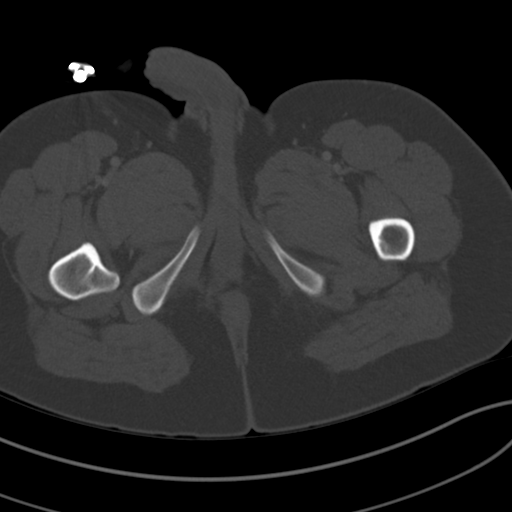
[im 12/95  soft-tissue]
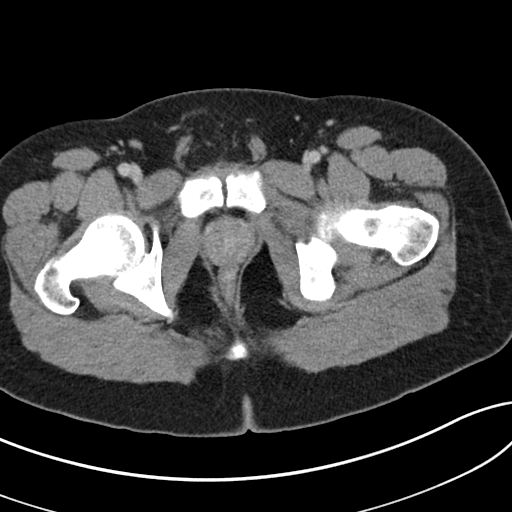
[im 19/95  soft-tissue]
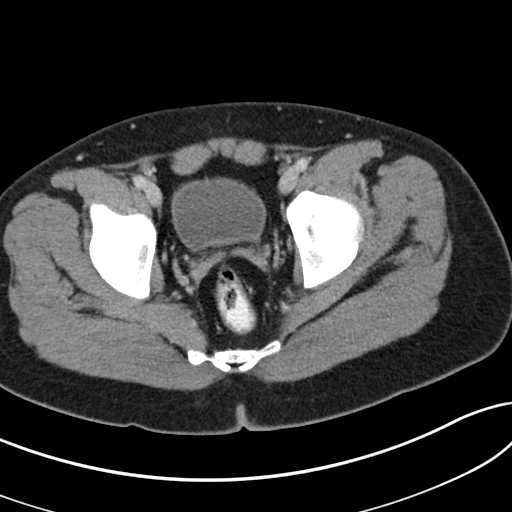
[im 27/95  soft-tissue]
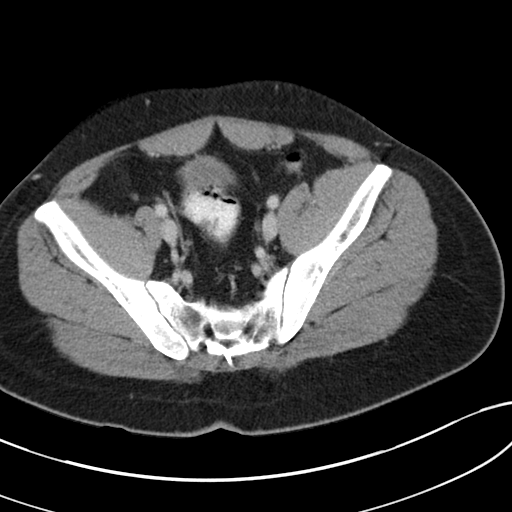
[im 34/95  soft-tissue]
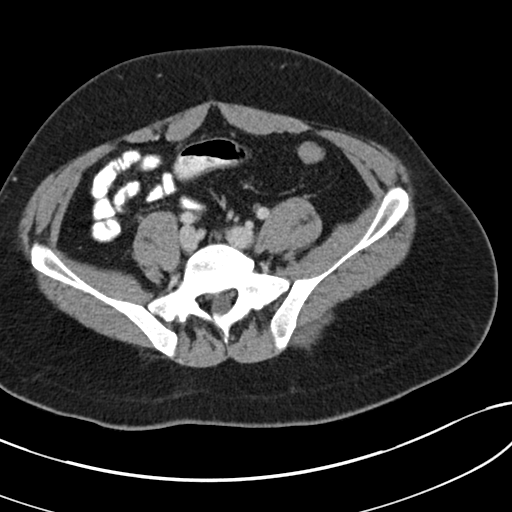
[im 42/95  soft-tissue]
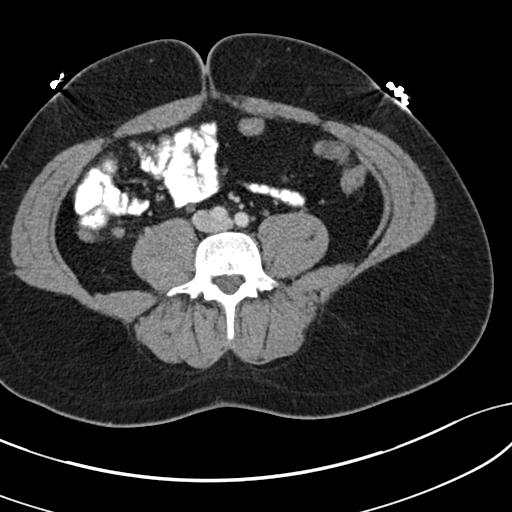
[im 49/95  soft-tissue]
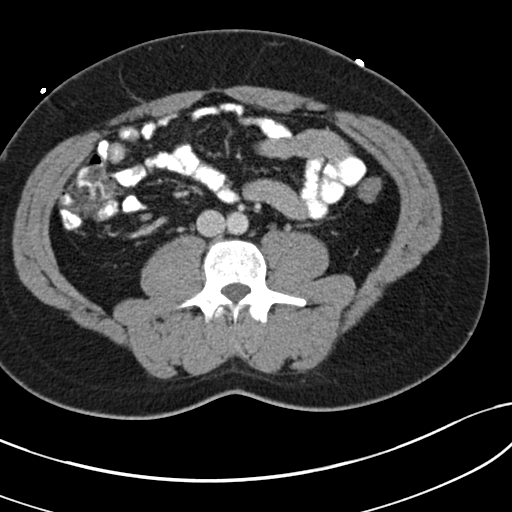
[im 53/95  soft-tissue]
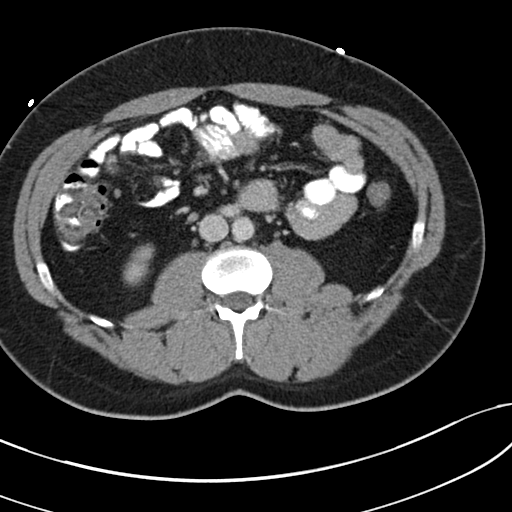
[im 61/95  soft-tissue]
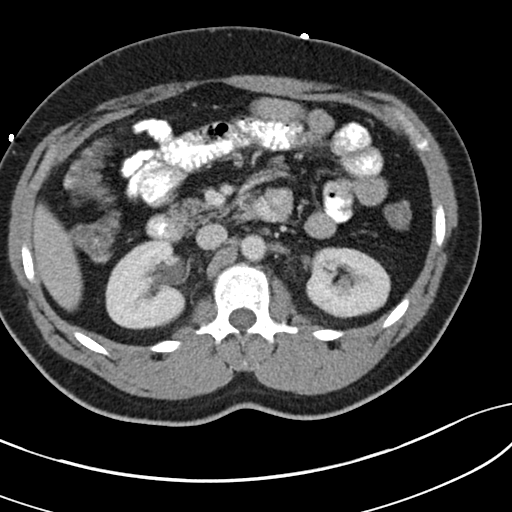
[im 61/95  bone]
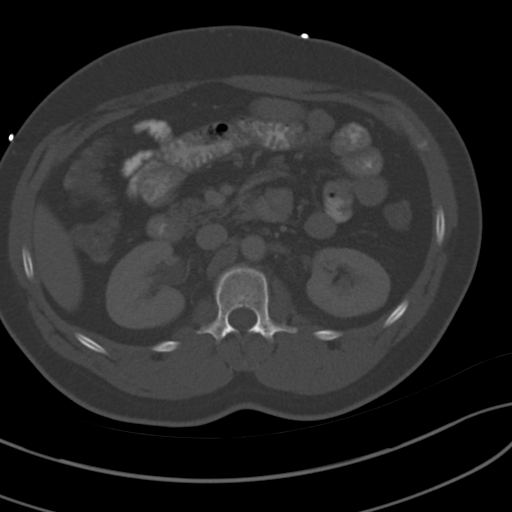
[im 68/95  soft-tissue]
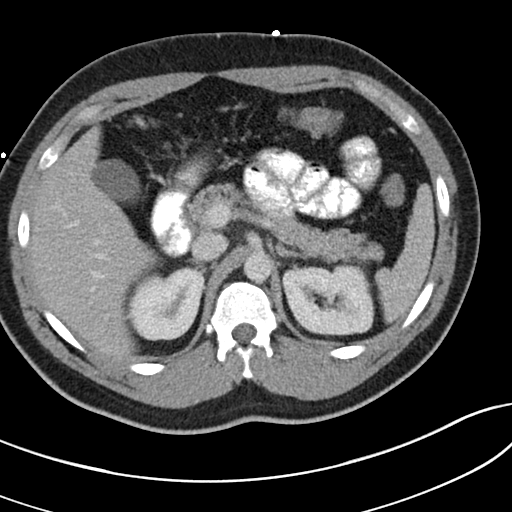
[im 76/95  soft-tissue]
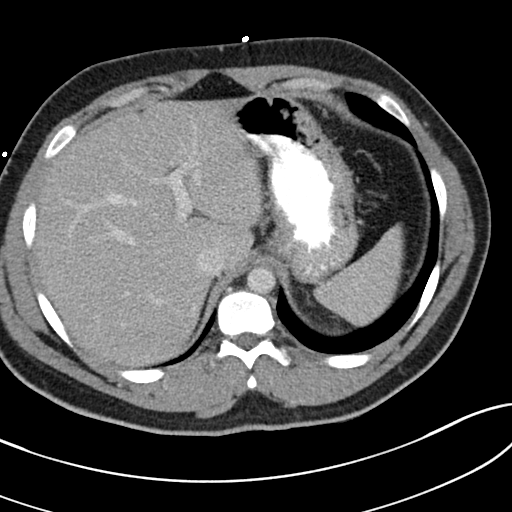
[im 83/95  soft-tissue]
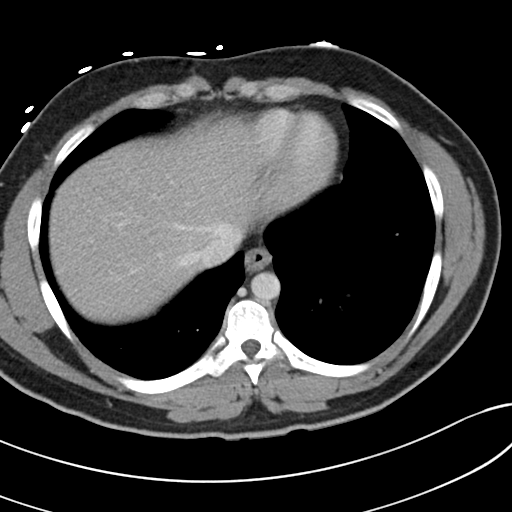
[im 91/95  soft-tissue]
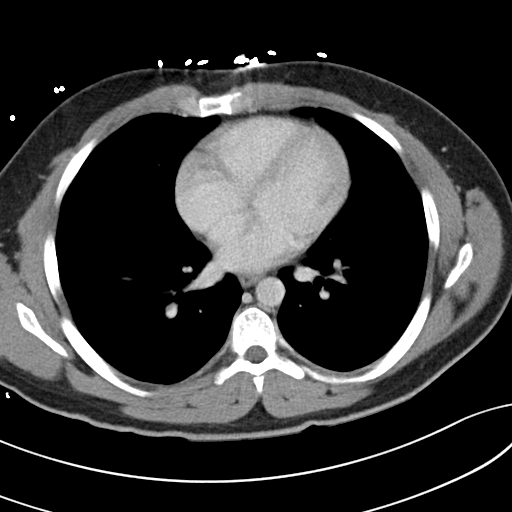

[Series 5: coronals · coronal · 0.89mm/px · 3 of 180 slices shown]
[im 60/180  soft-tissue]
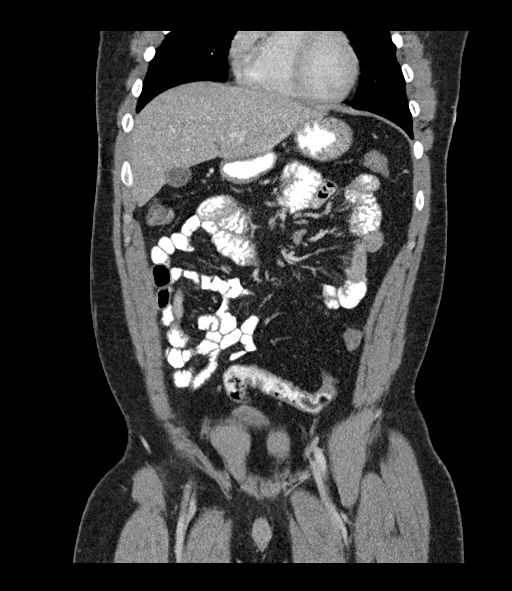
[im 80/180  soft-tissue]
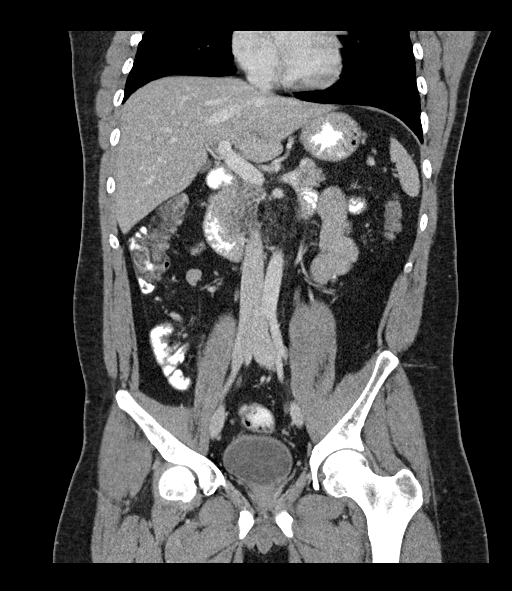
[im 100/180  soft-tissue]
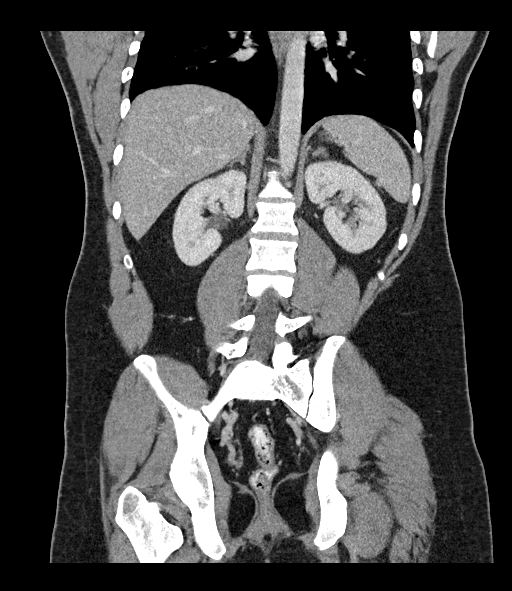

[16 of 46 positions shown; findings below may reference images not displayed]

FINDINGS: The lung bases are unremarkable.

The liver, spleen, adrenals, pancreas, kidneys are unremarkable.

An 8 mm lymph node is appreciated within the right pericolic gutter
region of questionable clinical significance. There is no evidence
of associated free fluid, loculated fluid collections. The appendix
is identified and is unremarkable.

There is no CT evidence of colitis, enteritis, diverticulitis, nor
appendicitis. There is no evidence of abdominal aortic aneurysm. The
celiac, SMA, IMA, portal vein are opacified. There is no evidence
calcified gallstones. There is no evidence of abdominal or pelvic
masses nor pathologic sized adenopathy. No evidence of free fluid,
loculated fluid collections, nor pneumoperitoneum.

A very small fat containing umbilical hernia is appreciated. No
further evidence of abdominal wall nor inguinal hernia. There is no
evidence of aggressive appearing osseous lesions.
IMPRESSION: 1. No CT evidence of obstructive or inflammatory abnormalities.
2. Small right pericolic gutter lymph node is may reflect a small
reactive lymph node.
3. Otherwise no focal acute abnormalities accounting for the
patient's clinical presentation.
